# Patient Record
Sex: Female | Born: 1995 | Race: Black or African American | Hispanic: No | Marital: Single | Smoking: Never smoker
Health system: Southern US, Community
[De-identification: ages and names within clinical notes are randomized; demographics above are authoritative.]

## PROBLEM LIST (undated history)

## (undated) DIAGNOSIS — F4310 Post-traumatic stress disorder, unspecified: Secondary | ICD-10-CM

## (undated) DIAGNOSIS — G43909 Migraine, unspecified, not intractable, without status migrainosus: Secondary | ICD-10-CM

## (undated) DIAGNOSIS — I2699 Other pulmonary embolism without acute cor pulmonale: Secondary | ICD-10-CM

## (undated) DIAGNOSIS — I10 Essential (primary) hypertension: Secondary | ICD-10-CM

## (undated) HISTORY — PX: ORTHOPEDIC SURGERY: SHX850

---
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
## 2013-05-12 ENCOUNTER — Other Ambulatory Visit: Payer: Self-pay

## 2013-05-12 ENCOUNTER — Emergency Department (HOSPITAL_BASED_OUTPATIENT_CLINIC_OR_DEPARTMENT_OTHER): Payer: Medicaid Other

## 2013-05-12 ENCOUNTER — Emergency Department (HOSPITAL_BASED_OUTPATIENT_CLINIC_OR_DEPARTMENT_OTHER)
Admission: EM | Admit: 2013-05-12 | Discharge: 2013-05-12 | Disposition: A | Discharge status: Home / Self Care | Payer: Medicaid Other | Attending: Emergency Medicine | Admitting: Emergency Medicine

## 2013-05-12 ENCOUNTER — Encounter (HOSPITAL_BASED_OUTPATIENT_CLINIC_OR_DEPARTMENT_OTHER): Payer: Self-pay | Admitting: *Deleted

## 2013-05-12 DIAGNOSIS — R0602 Shortness of breath: Secondary | ICD-10-CM | POA: Insufficient documentation

## 2013-05-12 DIAGNOSIS — R6883 Chills (without fever): Secondary | ICD-10-CM | POA: Insufficient documentation

## 2013-05-12 DIAGNOSIS — Z8679 Personal history of other diseases of the circulatory system: Secondary | ICD-10-CM | POA: Insufficient documentation

## 2013-05-12 DIAGNOSIS — R0600 Dyspnea, unspecified: Secondary | ICD-10-CM

## 2013-05-12 DIAGNOSIS — Z79899 Other long term (current) drug therapy: Secondary | ICD-10-CM | POA: Insufficient documentation

## 2013-05-12 DIAGNOSIS — M79609 Pain in unspecified limb: Secondary | ICD-10-CM | POA: Insufficient documentation

## 2013-05-12 DIAGNOSIS — G43909 Migraine, unspecified, not intractable, without status migrainosus: Secondary | ICD-10-CM | POA: Insufficient documentation

## 2013-05-12 DIAGNOSIS — Z3202 Encounter for pregnancy test, result negative: Secondary | ICD-10-CM | POA: Insufficient documentation

## 2013-05-12 DIAGNOSIS — R609 Edema, unspecified: Secondary | ICD-10-CM | POA: Insufficient documentation

## 2013-05-12 DIAGNOSIS — R079 Chest pain, unspecified: Secondary | ICD-10-CM | POA: Insufficient documentation

## 2013-05-12 DIAGNOSIS — I10 Essential (primary) hypertension: Secondary | ICD-10-CM | POA: Insufficient documentation

## 2013-05-12 HISTORY — DX: Other pulmonary embolism without acute cor pulmonale: I26.99

## 2013-05-12 LAB — CBC WITH DIFFERENTIAL/PLATELET
Eosinophils Absolute: 0.3 10*3/uL (ref 0.0–0.7)
HCT: 41.6 % (ref 36.0–46.0)
Hemoglobin: 14.3 g/dL (ref 12.0–15.0)
Lymphs Abs: 3 10*3/uL (ref 0.7–4.0)
MCH: 29.9 pg (ref 26.0–34.0)
Monocytes Absolute: 0.7 10*3/uL (ref 0.1–1.0)
Monocytes Relative: 9 % (ref 3–12)
Neutro Abs: 3.6 10*3/uL (ref 1.7–7.7)
Neutrophils Relative %: 48 % (ref 43–77)
RBC: 4.79 MIL/uL (ref 3.87–5.11)

## 2013-05-12 LAB — COMPREHENSIVE METABOLIC PANEL
Alkaline Phosphatase: 98 U/L (ref 39–117)
BUN: 14 mg/dL (ref 6–23)
Chloride: 105 mEq/L (ref 96–112)
Creatinine, Ser: 0.9 mg/dL (ref 0.50–1.10)
GFR calc Af Amer: >90 mL/min (ref >90)
Glucose, Bld: 82 mg/dL (ref 70–99)
Potassium: 3.8 mEq/L (ref 3.5–5.1)
Total Bilirubin: 0.5 mg/dL (ref 0.3–1.2)

## 2013-05-12 LAB — URINALYSIS, ROUTINE W REFLEX MICROSCOPIC
Bilirubin Urine: NEGATIVE
Ketones, ur: NEGATIVE mg/dL
Nitrite: NEGATIVE
Protein, ur: NEGATIVE mg/dL
Urobilinogen, UA: 1 mg/dL (ref 0.0–1.0)

## 2013-05-12 LAB — URINE MICROSCOPIC-ADD ON

## 2013-05-12 LAB — D-DIMER, QUANTITATIVE: D-Dimer, Quant: 0.27 ug/mL-FEU (ref 0.00–0.48)

## 2013-05-12 MED ORDER — NAPROXEN 500 MG PO TABS: 500.0000 mg | ORAL_TABLET | Freq: Two times a day (BID) | ORAL | Status: DC

## 2013-05-12 MED ORDER — PREDNISONE 50 MG PO TABS: 50.0000 mg | ORAL_TABLET | Freq: Every day | ORAL | Status: DC

## 2013-05-12 NOTE — ED Provider Notes (Signed)
History    This chart was scribed for Dione Booze, MD by Donne Anon, ED Scribe. This patient was seen in room MH12/MH12 and the patient's care was started at 1459.   CSN: [id number]  Arrival date & time 05/12/13  1427   First MD Initiated Contact with Patient 05/12/13 1459      Chief Complaint  Patient presents with  . Shortness of Breath  . Chest Pain     The history is provided by the patient and a parent. No language interpreter was used.   HPI Comments:  Kristie Baker is a 17 y.o. female who presents to the Emergency Department complaining of 1 week of gradual onset, gradually worsening SOB. She states the SOB was constant all week and then worsened today, and resulted in [facility] referring her to the ED. She denies any modifying factors. She states she is experiencing leg pain (left worse than right) and CP, which is worsened by deep breaths. She reports associated leg swelling, hand swelling, finger swelling and chills. She has tried laying down and ibuprofen with little relief. She denies fever, nausea, vomiting, abdominal pain, back pain, rash, or any other pain. She denies recent long travel or recent surgery. She reports she was normally in good health, but states she has had some health issues since her "accident." She states she does not currently smoking or drinking.      Past Medical History  Diagnosis Date  . Hypertension   . Migraines   . Hypertension 2012    Irregularities in BP; no longer on medication.  . PE (pulmonary embolism)     Past Surgical History  Procedure Laterality Date  . Orthopedic surgery      Family History  Problem Relation Age of Onset  . Asthma Brother   . Cancer Maternal Aunt   . Diabetes Maternal Grandmother     History  Substance Use Topics  . Smoking status: Never Smoker   . Smokeless tobacco: Not on file  . Alcohol Use: No    OB History   Grav Para Term Preterm Abortions TAB SAB Ect Mult Living                   Review of Systems  Constitutional: Positive for chills.  Respiratory: Positive for shortness of breath.   Cardiovascular: Positive for chest pain and leg swelling.  Musculoskeletal: Positive for joint swelling.  All other systems reviewed and are negative.    Allergies  Amitriptyline and Cymbalta  Home Medications   Current Outpatient Rx  Name  Route  Sig  Dispense  Refill  . medroxyPROGESTERone (DEPO-PROVERA) 150 MG/ML injection   Intramuscular   Inject 150 mg into the muscle every 3 (three) months.         . promethazine (PHENERGAN) 25 MG tablet   Oral   Take 25 mg by mouth every 6 (six) hours as needed for nausea.         Marland Kitchen topiramate (TOPAMAX) 15 MG capsule   Oral   Take 15 mg by mouth 2 (two) times daily.         . cetirizine (ZYRTEC) 10 MG tablet   Oral   Take 1 tablet (10 mg total) by mouth daily.   30 tablet   3   . cholecalciferol (VITAMIN D) 1000 UNITS tablet   Oral   Take 1,000 Units by mouth daily.          . Multiple Vitamin (MULITIVITAMIN WITH MINERALS)  TABS   Oral   Take 1 tablet by mouth daily.           . Riboflavin (VITAMIN B-2) 50 MG TABS   Oral   Take 1 tablet (50 mg total) by mouth daily.   30 tablet   0     Triage Vitals; BP 120/75  Pulse 70  Temp(Src) 98.8 F (37.1 C) (Oral)  Resp 16  Ht 5\' 5"  (1.651 m)  Wt 138 lb 8 oz (62.823 kg)  BMI 23.05 kg/m2  SpO2 100%  Physical Exam  Nursing note and vitals reviewed. Constitutional: She is oriented to person, place, and time. She appears well-developed and well-nourished. No distress.  HENT:  Head: Normocephalic and atraumatic.  Eyes: EOM are normal.  Neck: Neck supple. No tracheal deviation present.  Cardiovascular: Normal rate, regular rhythm and normal heart sounds.   Pulmonary/Chest: Effort normal and breath sounds normal. No respiratory distress. She has no wheezes. She has no rales.  Musculoskeletal: Normal range of motion.  1+ pitting edema of legs. Mild  swelling of the hands without pitting edema. Right calf circumference is 1 cm greater than left calf circumference. No cord palpable. Negative Homan's sign.  Neurological: She is alert and oriented to person, place, and time.  Skin: Skin is warm and dry.  Psychiatric: She has a normal mood and affect. Her behavior is normal.    ED Course  Procedures (including critical care time) DIAGNOSTIC STUDIES: Oxygen Saturation is 100% on RA, normal by my interpretation.    COORDINATION OF CARE: 3:08 PM Discussed treatment plan which includes labs, CXR, EKG, urinalysis and ultrasound with pt at bedside and pt agreed to plan.    Results for orders placed during the hospital encounter of 05/12/13  CBC WITH DIFFERENTIAL      Result Value Range   WBC 7.6  4.0 - 10.5 K/uL   RBC 4.79  3.87 - 5.11 MIL/uL   Hemoglobin 14.3  12.0 - 15.0 g/dL   HCT 16.1  09.6 - 04.5 %   MCV 86.8  78.0 - 100.0 fL   MCH 29.9  26.0 - 34.0 pg   MCHC 34.4  30.0 - 36.0 g/dL   RDW 40.9  81.1 - 91.4 %   Platelets 244  150 - 400 K/uL   Neutrophils Relative % 48  43 - 77 %   Neutro Abs 3.6  1.7 - 7.7 K/uL   Lymphocytes Relative 39  12 - 46 %   Lymphs Abs 3.0  0.7 - 4.0 K/uL   Monocytes Relative 9  3 - 12 %   Monocytes Absolute 0.7  0.1 - 1.0 K/uL   Eosinophils Relative 4  0 - 5 %   Eosinophils Absolute 0.3  0.0 - 0.7 K/uL   Basophils Relative 0  0 - 1 %   Basophils Absolute 0.0  0.0 - 0.1 K/uL  COMPREHENSIVE METABOLIC PANEL      Result Value Range   Sodium 140  135 - 145 mEq/L   Potassium 3.8  3.5 - 5.1 mEq/L   Chloride 105  96 - 112 mEq/L   CO2 26  19 - 32 mEq/L   Glucose, Bld 82  70 - 99 mg/dL   BUN 14  6 - 23 mg/dL   Creatinine, Ser 7.82  0.50 - 1.10 mg/dL   Calcium 9.5  8.4 - 95.6 mg/dL   Total Protein 7.2  6.0 - 8.3 g/dL   Albumin 4.0  3.5 - 5.2 g/dL  AST 24  0 - 37 U/L   ALT 16  0 - 35 U/L   Alkaline Phosphatase 98  39 - 117 U/L   Total Bilirubin 0.5  0.3 - 1.2 mg/dL   GFR calc non Af Amer >90  >90  mL/min   GFR calc Af Amer >90  >90 mL/min  D-DIMER, QUANTITATIVE      Result Value Range   D-Dimer, Quant 0.27  0.00 - 0.48 ug/mL-FEU  URINALYSIS, ROUTINE W REFLEX MICROSCOPIC      Result Value Range   Color, Urine YELLOW  YELLOW   APPearance CLEAR  CLEAR   Specific Gravity, Urine 1.022  1.005 - 1.030   pH 8.0  5.0 - 8.0   Glucose, UA NEGATIVE  NEGATIVE mg/dL   Hgb urine dipstick NEGATIVE  NEGATIVE   Bilirubin Urine NEGATIVE  NEGATIVE   Ketones, ur NEGATIVE  NEGATIVE mg/dL   Protein, ur NEGATIVE  NEGATIVE mg/dL   Urobilinogen, UA 1.0  0.0 - 1.0 mg/dL   Nitrite NEGATIVE  NEGATIVE   Leukocytes, UA SMALL (*) NEGATIVE  PREGNANCY, URINE      Result Value Range   Preg Test, Ur NEGATIVE  NEGATIVE  URINE MICROSCOPIC-ADD ON      Result Value Range   Squamous Epithelial / LPF RARE  RARE   WBC, UA 0-2  <3 WBC/hpf   RBC / HPF 0-2  <3 RBC/hpf   Bacteria, UA FEW (*) RARE   Urine-Other MUCOUS PRESENT     Dg Chest 2 View  05/12/2013   *RADIOLOGY REPORT*  Clinical Data: Shortness of breath and chest pain.  CHEST - 2 VIEW  Comparison: PA and lateral chest 06/13/2011.  Findings: Lungs are clear.  Heart size is normal.  No pneumothorax or pleural fluid.  Old healed right clavicle fracture noted.  IMPRESSION: No acute disease.   Original Report Authenticated By: Holley Dexter, M.D.   US Venous Img Lower Bilateral  05/12/2013   *RADIOLOGY REPORT*  Clinical Data: Shortness of breath, chest pain, and bilateral leg pain for several days.  BILATERAL LOWER EXTREMITY VENOUS DUPLEX ULTRASOUND  Technique:  Gray-scale sonography with graded compression, as well as color Doppler and duplex ultrasound, were performed to evaluate the deep venous system of both lower extremities from the level of the common femoral vein through the popliteal and proximal calf veins.  Spectral Doppler was utilized to evaluate flow at rest and with distal augmentation maneuvers.  Comparison:  None.  Findings:  Normal compressibility  of bilateral common femoral, superficial femoral, and popliteal veins is demonstrated, as well as the visualized proximal calf veins.  No filling defects to suggest DVT on grayscale or color Doppler imaging.  Doppler waveforms show normal direction of venous flow, normal respiratory phasicity and response to augmentation.  IMPRESSION: No evidence of deep vein thrombosis in either lower extremity.   Original Report Authenticated By: Davonna Belling, M.D.      ECG shows normal sinus rhythm with a rate of 63, no ectopy. Normal axis. Normal P wave. Normal QRS. Normal intervals. Normal ST and T waves. Impression: normal ECG. No prior ECG available for comparison.   1. Dyspnea   2. Chest pain   3. Peripheral edema       MDM  Leg pain, chest pain, and dyspnea of uncertain cause. Her swelling does seem more generalized than just leg edema so I feel that DVT and pulmonary embolism is less likely. Urinalysis will be obtained to look for evidence of  proteinuria for possible nephrotic syndrome. This could be an early manifestation of an autoimmune disease such as lupus. Venous Dopplers will be obtained to rule out DVT.  Workup is negative including negative venous Doppler, normal chest x-ray, no evidence of proteinuria, normal renal function. At this point, I feel the most likely cause would be an autoimmune disorder such as lupus. She'll be given a therapeutic trial of prednisone and started on naproxen and she is to followup with her PCP in 1 week off to evaluate response to treatment and consider further workup.  I personally performed the services described in this documentation, which was scribed in my presence. The recorded information has been reviewed and is accurate.         Dione Booze, MD 05/12/13 1622

## 2013-05-12 NOTE — ED Notes (Signed)
Patient transported to X-ray 

## 2013-05-12 NOTE — ED Notes (Signed)
Sent here from [facility] for ? PE c/o SOb and cp x 1 week  HX PE

## 2013-05-12 NOTE — ED Notes (Signed)
MD at bedside. 

## 2013-06-11 ENCOUNTER — Other Ambulatory Visit: Payer: Self-pay | Admitting: Family Medicine

## 2014-09-06 ENCOUNTER — Encounter (HOSPITAL_BASED_OUTPATIENT_CLINIC_OR_DEPARTMENT_OTHER): Payer: Self-pay | Admitting: Emergency Medicine

## 2014-09-06 ENCOUNTER — Emergency Department (HOSPITAL_BASED_OUTPATIENT_CLINIC_OR_DEPARTMENT_OTHER)
Admission: EM | Admit: 2014-09-06 | Discharge: 2014-09-06 | Disposition: A | Discharge status: Home / Self Care | Payer: Medicaid Other | Attending: Emergency Medicine | Admitting: Emergency Medicine

## 2014-09-06 DIAGNOSIS — G43909 Migraine, unspecified, not intractable, without status migrainosus: Secondary | ICD-10-CM | POA: Insufficient documentation

## 2014-09-06 DIAGNOSIS — I10 Essential (primary) hypertension: Secondary | ICD-10-CM | POA: Diagnosis not present

## 2014-09-06 NOTE — ED Notes (Signed)
Patient here with complaint of frontal migraine headache x 2 days with vomiting, history of same. Has taken ibuprofen for same with none today

## 2014-09-06 NOTE — ED Notes (Signed)
Explained to patient delay and they stated they would see their primary MD

## 2016-03-19 ENCOUNTER — Emergency Department (HOSPITAL_BASED_OUTPATIENT_CLINIC_OR_DEPARTMENT_OTHER): Payer: Medicare HMO

## 2016-03-19 ENCOUNTER — Emergency Department (HOSPITAL_BASED_OUTPATIENT_CLINIC_OR_DEPARTMENT_OTHER)
Admission: EM | Admit: 2016-03-19 | Discharge: 2016-03-19 | Disposition: A | Discharge status: Home / Self Care | Payer: Medicare HMO | Attending: Emergency Medicine | Admitting: Emergency Medicine

## 2016-03-19 ENCOUNTER — Encounter (HOSPITAL_BASED_OUTPATIENT_CLINIC_OR_DEPARTMENT_OTHER): Payer: Self-pay | Admitting: *Deleted

## 2016-03-19 DIAGNOSIS — N83291 Other ovarian cyst, right side: Secondary | ICD-10-CM | POA: Diagnosis not present

## 2016-03-19 DIAGNOSIS — Z791 Long term (current) use of non-steroidal anti-inflammatories (NSAID): Secondary | ICD-10-CM | POA: Diagnosis not present

## 2016-03-19 DIAGNOSIS — N750 Cyst of Bartholin's gland: Secondary | ICD-10-CM | POA: Insufficient documentation

## 2016-03-19 DIAGNOSIS — R109 Unspecified abdominal pain: Secondary | ICD-10-CM | POA: Diagnosis present

## 2016-03-19 DIAGNOSIS — I10 Essential (primary) hypertension: Secondary | ICD-10-CM | POA: Insufficient documentation

## 2016-03-19 DIAGNOSIS — Z79899 Other long term (current) drug therapy: Secondary | ICD-10-CM | POA: Diagnosis not present

## 2016-03-19 DIAGNOSIS — Z86711 Personal history of pulmonary embolism: Secondary | ICD-10-CM | POA: Diagnosis not present

## 2016-03-19 LAB — CBC WITH DIFFERENTIAL/PLATELET
Basophils Absolute: 0 10*3/uL (ref 0.0–0.1)
Basophils Relative: 0 %
Eosinophils Absolute: 0 10*3/uL (ref 0.0–0.7)
Eosinophils Relative: 0 %
HCT: 38.6 % (ref 36.0–46.0)
Hemoglobin: 12.9 g/dL (ref 12.0–15.0)
Lymphocytes Relative: 12 %
Lymphs Abs: 1.7 10*3/uL (ref 0.7–4.0)
MCH: 29.1 pg (ref 26.0–34.0)
MCHC: 33.4 g/dL (ref 30.0–36.0)
MCV: 87.1 fL (ref 78.0–100.0)
Monocytes Absolute: 1.4 10*3/uL — ABNORMAL HIGH (ref 0.1–1.0)
Monocytes Relative: 10 %
Neutro Abs: 11.1 10*3/uL — ABNORMAL HIGH (ref 1.7–7.7)
Neutrophils Relative %: 78 %
Platelets: 212 10*3/uL (ref 150–400)
RBC: 4.43 MIL/uL (ref 3.87–5.11)
RDW: 12.7 % (ref 11.5–15.5)
WBC: 14.3 10*3/uL — ABNORMAL HIGH (ref 4.0–10.5)

## 2016-03-19 LAB — COMPREHENSIVE METABOLIC PANEL
ALT: 13 U/L — ABNORMAL LOW (ref 14–54)
AST: 16 U/L (ref 15–41)
Albumin: 3.6 g/dL (ref 3.5–5.0)
Alkaline Phosphatase: 53 U/L (ref 38–126)
Anion gap: 5 (ref 5–15)
BUN: 5 mg/dL — ABNORMAL LOW (ref 6–20)
CO2: 26 mmol/L (ref 22–32)
Calcium: 8.5 mg/dL — ABNORMAL LOW (ref 8.9–10.3)
Chloride: 110 mmol/L (ref 101–111)
Creatinine, Ser: 0.9 mg/dL (ref 0.44–1.00)
GFR calc Af Amer: >60 mL/min (ref >60)
GFR calc non Af Amer: >60 mL/min (ref >60)
Glucose, Bld: 89 mg/dL (ref 65–99)
Potassium: 3.2 mmol/L — ABNORMAL LOW (ref 3.5–5.1)
Sodium: 141 mmol/L (ref 135–145)
Total Bilirubin: 0.8 mg/dL (ref 0.3–1.2)
Total Protein: 6.8 g/dL (ref 6.5–8.1)

## 2016-03-19 LAB — URINALYSIS, ROUTINE W REFLEX MICROSCOPIC
Bilirubin Urine: NEGATIVE
GLUCOSE, UA: NEGATIVE mg/dL
Ketones, ur: NEGATIVE mg/dL
Nitrite: NEGATIVE
PROTEIN: 30 mg/dL — AB
Specific Gravity, Urine: 1.003 — ABNORMAL LOW (ref 1.005–1.030)
pH: 6 (ref 5.0–8.0)

## 2016-03-19 LAB — URINE MICROSCOPIC-ADD ON

## 2016-03-19 LAB — WET PREP, GENITAL
Sperm: NONE SEEN
Trich, Wet Prep: NONE SEEN
Yeast Wet Prep HPF POC: NONE SEEN

## 2016-03-19 LAB — PREGNANCY, URINE: PREG TEST UR: NEGATIVE

## 2016-03-19 MED ORDER — PROMETHAZINE HCL 25 MG PO TABS: 25.0000 mg | ORAL_TABLET | Freq: Three times a day (TID) | ORAL | Status: DC | PRN

## 2016-03-19 MED ORDER — ONDANSETRON HCL 4 MG/2ML IJ SOLN
4.0000 mg | Freq: Once | INTRAMUSCULAR | Status: AC
  Administered 2016-03-19: 4 mg via INTRAVENOUS
  Filled 2016-03-19: qty 2

## 2016-03-19 MED ORDER — IOPAMIDOL (ISOVUE-300) INJECTION 61%
100.0000 mL | Freq: Once | INTRAVENOUS | Status: AC | PRN
  Administered 2016-03-19: 100 mL via INTRAVENOUS

## 2016-03-19 MED ORDER — HYDROCODONE-ACETAMINOPHEN 5-325 MG PO TABS: 1.0000 | ORAL_TABLET | Freq: Four times a day (QID) | ORAL | Status: DC | PRN

## 2016-03-19 MED ORDER — MORPHINE SULFATE (PF) 4 MG/ML IV SOLN
4.0000 mg | Freq: Once | INTRAVENOUS | Status: AC
  Administered 2016-03-19: 4 mg via INTRAVENOUS
  Filled 2016-03-19: qty 1

## 2016-03-19 MED ORDER — SODIUM CHLORIDE 0.9 % IV BOLUS (SEPSIS)
1000.0000 mL | Freq: Once | INTRAVENOUS | Status: AC
  Administered 2016-03-19: 1000 mL via INTRAVENOUS

## 2016-03-19 MED ORDER — IBUPROFEN 400 MG PO TABS
600.0000 mg | ORAL_TABLET | Freq: Once | ORAL | Status: AC
  Administered 2016-03-19: 600 mg via ORAL
  Filled 2016-03-19: qty 1

## 2016-03-19 MED ORDER — DOXYCYCLINE HYCLATE 100 MG PO CAPS: 100.0000 mg | ORAL_CAPSULE | Freq: Two times a day (BID) | ORAL | Status: DC

## 2016-03-19 NOTE — Discharge Instructions (Signed)
You have a complex cystic structure of the right ovary and her Bartholin's gland also has a cyst that is why use are having pain in the labial area.  You will need to follow-up with the GYN provided.  Use warm soaks and warm compresses on the labial region.  Return here for any worsening in your condition

## 2016-03-19 NOTE — ED Notes (Signed)
Patient c/o RLQ abd pain/cramping, nausea. SHe states that she went to the doctor on Thursday and was treated for a yeast infection.

## 2016-03-19 NOTE — ED Provider Notes (Signed)
CSN: [id number]     Arrival date & time 03/19/16  1012 History   First MD Initiated Contact with Patient 03/19/16 1019     Chief Complaint  Patient presents with  . Abdominal Pain     (Consider location/radiation/quality/duration/timing/severity/associated sxs/prior Treatment) HPI Patient presents to the emergency department with mid to right sided abdominal pain started this past Wednesday, and the patient states that she was seen by her primary doctor and treated for a yeast infection.  Patient states that her condition has not improved since that time.  Patient states nothing seems make the condition better.  Palpation makes the pain worse.  Patient denies taking any other medications other than the ones prescribed.  Patient is also complaining of vaginal painThe patient denies chest pain, shortness of breath, headache,blurred vision, neck pain, fever, cough, weakness, numbness, dizziness, anorexia, edema, vomiting, diarrhea, rash, back pain, dysuria, hematemesis, bloody stool, vaginal bleeding near syncope, or syncope. Past Medical History  Diagnosis Date  . Hypertension   . Migraines   . Hypertension 2012    Irregularities in BP; no longer on medication.  . PE (pulmonary embolism)    Past Surgical History  Procedure Laterality Date  . Orthopedic surgery     Family History  Problem Relation Age of Onset  . Asthma Brother   . Cancer Maternal Aunt   . Diabetes Maternal Grandmother    Social History  Substance Use Topics  . Smoking status: Never Smoker   . Smokeless tobacco: None  . Alcohol Use: No   OB History    No data available     Review of Systems All other systems negative except as documented in the HPI. All pertinent positives and negatives as reviewed in the HPI.   Allergies  Amitriptyline and Cymbalta  Home Medications   Prior to Admission medications   Medication Sig Start Date End Date Taking? Authorizing Provider  ALPRAZolam (XANAX PO) Take by mouth as  needed.   Yes Historical Provider, MD  PAROXETINE HCL PO Take by mouth daily.   Yes Historical Provider, MD  cetirizine (ZYRTEC) 10 MG tablet Take 1 tablet (10 mg total) by mouth daily. 07/20/12 07/20/13  Roderick PeeMolly Rusin, MD  cholecalciferol (VITAMIN D) 1000 UNITS tablet Take 1,000 Units by mouth daily.     Historical Provider, MD  Multiple Vitamin (MULITIVITAMIN WITH MINERALS) TABS Take 1 tablet by mouth daily.      Historical Provider, MD  naproxen (NAPROSYN) 500 MG tablet Take 1 tablet (500 mg total) by mouth 2 (two) times daily. 05/12/13   Dione Boozeavid Glick, MD  Riboflavin (VITAMIN B-2) 50 MG TABS Take 1 tablet (50 mg total) by mouth daily. 07/20/12   Roderick PeeMolly Rusin, MD   BP 118/74 mmHg  Pulse 68  Temp(Src) 100.6 F (38.1 C) (Rectal)  Resp 16  SpO2 100%  LMP 02/23/2016 Physical Exam  Constitutional: She is oriented to person, place, and time. She appears well-developed and well-nourished. No distress.  HENT:  Head: Normocephalic and atraumatic.  Mouth/Throat: Oropharynx is clear and moist.  Eyes: Pupils are equal, round, and reactive to light.  Neck: Normal range of motion. Neck supple.  Cardiovascular: Normal rate, regular rhythm and normal heart sounds.  Exam reveals no gallop and no friction rub.   No murmur heard. Pulmonary/Chest: Effort normal and breath sounds normal. No respiratory distress. She has no wheezes.  Abdominal: Soft. Bowel sounds are normal. She exhibits no distension. There is tenderness. There is no rebound and no guarding.  Genitourinary:    Vaginal discharge found.  Neurological: She is alert and oriented to person, place, and time. She exhibits normal muscle tone. Coordination normal.  Skin: Skin is warm and dry. No rash noted. No erythema.  Psychiatric: She has a normal mood and affect. Her behavior is normal.  Nursing note and vitals reviewed.   ED Course  Procedures (including critical care time) Labs Review Labs Reviewed  WET PREP, GENITAL - Abnormal; Notable for  the following:    Clue Cells Wet Prep HPF POC PRESENT (*)    WBC, Wet Prep HPF POC MANY (*)    All other components within normal limits  URINALYSIS, ROUTINE W REFLEX MICROSCOPIC (NOT AT [facility]) - Abnormal; Notable for the following:    Specific Gravity, Urine 1.003 (*)    Hgb urine dipstick SMALL (*)    Protein, ur 30 (*)    Leukocytes, UA SMALL (*)    All other components within normal limits  COMPREHENSIVE METABOLIC PANEL - Abnormal; Notable for the following:    Potassium 3.2 (*)    BUN 5 (*)    Calcium 8.5 (*)    ALT 13 (*)    All other components within normal limits  CBC WITH DIFFERENTIAL/PLATELET - Abnormal; Notable for the following:    WBC 14.3 (*)    Neutro Abs 11.1 (*)    Monocytes Absolute 1.4 (*)    All other components within normal limits  URINE MICROSCOPIC-ADD ON - Abnormal; Notable for the following:    Squamous Epithelial / LPF 0-5 (*)    Bacteria, UA FEW (*)    All other components within normal limits  PREGNANCY, URINE  RPR  GC/CHLAMYDIA PROBE AMP ([place]) NOT AT [facility]    Imaging Review US Transvaginal Non-ob  03/19/2016  CLINICAL DATA:  Differential consider EXAM: TRANSABDOMINAL AND TRANSVAGINAL ULTRASOUND OF PELVIS DOPPLER ULTRASOUND OF OVARIES TECHNIQUE: Both transabdominal and transvaginal ultrasound examinations of the pelvis were performed. Transabdominal technique was performed for global imaging of the pelvis including uterus, ovaries, adnexal regions, and pelvic cul-de-sac. It was necessary to proceed with endovaginal exam following the transabdominal exam to visualize the uterus, endometrium and ovaries. Color and duplex Doppler ultrasound was utilized to evaluate blood flow to the ovaries. COMPARISON:  None. FINDINGS: Uterus Measurements: 9.6 x 3.4 x 5.2 cm. No fibroids or other mass visualized. Endometrium Thickness: 6.3 mm.  No focal abnormality visualized. Right ovary Measurements: 7.9 x 5.8 x 7.2 cm. Cystic structure in the right ovary measures 7.4  x 5.0 x 6.0 cm. This contains numerous internal areas of septation some of which appear thickened. No internal blood flow identified within this structure. Left ovary Measurements: 3.2 x 2.5 x 2.1 cm. Normal appearance/no adnexal mass. Pulsed Doppler evaluation of both ovaries demonstrates normal low-resistance arterial and venous waveforms. Other findings No abnormal free fluid. IMPRESSION: 1. Complex cystic structure within the right ovary contains numerous internal areas of septation some of which appear irregular and thickened. 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 Gynecologic consultation is advised. 2. No evidence for ovarian torsion. Electronically Signed   By: Signa Kell M.D.   On: 03/19/2016 15:08   US Pelvis Complete  03/19/2016  CLINICAL DATA:  Differential consider EXAM: TRANSABDOMINAL AND TRANSVAGINAL ULTRASOUND OF PELVIS DOPPLER ULTRASOUND OF OVARIES TECHNIQUE: Both transabdominal and transvaginal ultrasound examinations of the pelvis were performed. Transabdominal technique was performed for global imaging of the pelvis including uterus, ovaries, adnexal regions, and pelvic cul-de-sac. It  was necessary to proceed with endovaginal exam following the transabdominal exam to visualize the uterus, endometrium and ovaries. Color and duplex Doppler ultrasound was utilized to evaluate blood flow to the ovaries. COMPARISON:  None. FINDINGS: Uterus Measurements: 9.6 x 3.4 x 5.2 cm. No fibroids or other mass visualized. Endometrium Thickness: 6.3 mm.  No focal abnormality visualized. Right ovary Measurements: 7.9 x 5.8 x 7.2 cm. Cystic structure in the right ovary measures 7.4 x 5.0 x 6.0 cm. This contains numerous internal areas of septation some of which appear thickened. No internal blood flow identified within this structure. Left ovary Measurements: 3.2 x 2.5 x 2.1 cm. Normal appearance/no adnexal mass. Pulsed Doppler evaluation of both ovaries demonstrates normal  low-resistance arterial and venous waveforms. Other findings No abnormal free fluid. IMPRESSION: 1. Complex cystic structure within the right ovary contains numerous internal areas of septation some of which appear irregular and thickened. 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 Gynecologic consultation is advised. 2. No evidence for ovarian torsion. Electronically Signed   By: Signa Kell M.D.   On: 03/19/2016 15:08   Ct Abdomen Pelvis W Contrast  03/19/2016  CLINICAL DATA:  Bilateral lower abdominal pain for several days. Recent vaginal yeast infection. EXAM: CT ABDOMEN AND PELVIS WITH CONTRAST TECHNIQUE: Multidetector CT imaging of the abdomen and pelvis was performed using the standard protocol following bolus administration of intravenous contrast. CONTRAST:  ISOVUE-300 IOPAMIDOL (ISOVUE-300) INJECTION 61% COMPARISON:  None. FINDINGS: Minimal ground-glass opacity in the right middle lobe on image 5 of series 4, likely focal atelectasis. No suspicious infiltrates are seen in the lungs. There is a tiny effusion on the right however. The lung bases are otherwise within normal limits. No free air. Trace free fluid in the pelvis. The liver and portal vein are normal. Layering stones or wall thickening in the posterior gallbladder on axial image 30 aa. Possible enhancement of gallbladder wall on image 33 of the axial images. Trace fluid adjacent to the gallbladder is not excluded on these images. The spleen, adrenal glands, pancreas, and kidneys are normal. The abdominal aorta is normal in caliber with no dissection. No adenopathy. The stomach is normal in appearance. The small bowel is normal as well. The colon and visualized appendix are normal. No evidence of appendicitis. No adenopathy. There is a large cystic mass in the pelvis, displacing the uterus to the left and anteriorly. The mass measures 7.4 x 6.5 cm on series 2, image 66. A separate right ovary is not seen. The  left ovary is probably just posterior to the uterus on image 69. The bladder is normal. No adenopathy. Trace free fluid in the pelvis. A complicated collection is seen in the right labia, most consistent with a complicated Bartholin cyst. A similar but smaller finding is seen on the left on axial image 87. The larger right collection measures up to 4.4 by 2.9 cm. Surgical hardware is seen in the pelvis. Healed rib fractures are seen on the left. No acute bony abnormalities identified. IMPRESSION: 1. There is a large cystic mass in the pelvis, likely arising from the right ovary, measuring up to 7.4 cm. A pelvic ultrasound could better evaluate. 2. Bartholin Cysts in the labia. The right Barhtolin Cyst is large, measuring 4.4 cm, and complicated. Recommend clinical correlation. 3. The gallbladder may contain stones versus is polyps or a thickened wall. There may be a small amount of pericholecystic fluid and a small amount of wall enhancement is not excluded.  A right upper quadrant ultrasound could better evaluate the gallbladder. Electronically Signed   By: Gerome Sam III M.D   On: 03/19/2016 13:45   [place] Art/ven Flow Abd Pelv Doppler  03/19/2016  CLINICAL DATA:  Differential consider EXAM: TRANSABDOMINAL AND TRANSVAGINAL ULTRASOUND OF PELVIS DOPPLER ULTRASOUND OF OVARIES TECHNIQUE: Both transabdominal and transvaginal ultrasound examinations of the pelvis were performed. Transabdominal technique was performed for global imaging of the pelvis including uterus, ovaries, adnexal regions, and pelvic cul-de-sac. It was necessary to proceed with endovaginal exam following the transabdominal exam to visualize the uterus, endometrium and ovaries. Color and duplex Doppler ultrasound was utilized to evaluate blood flow to the ovaries. COMPARISON:  None. FINDINGS: Uterus Measurements: 9.6 x 3.4 x 5.2 cm. No fibroids or other mass visualized. Endometrium Thickness: 6.3 mm.  No focal abnormality visualized. Right ovary  Measurements: 7.9 x 5.8 x 7.2 cm. Cystic structure in the right ovary measures 7.4 x 5.0 x 6.0 cm. This contains numerous internal areas of septation some of which appear thickened. No internal blood flow identified within this structure. Left ovary Measurements: 3.2 x 2.5 x 2.1 cm. Normal appearance/no adnexal mass. Pulsed Doppler evaluation of both ovaries demonstrates normal low-resistance arterial and venous waveforms. Other findings No abnormal free fluid. IMPRESSION: 1. Complex cystic structure within the right ovary contains numerous internal areas of septation some of which appear irregular and thickened. 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 Gynecologic consultation is advised. 2. No evidence for ovarian torsion. Electronically Signed   By: Signa Kell M.D.   On: 03/19/2016 15:08   I have personally reviewed and evaluated these images and lab results as part of my medical decision-making.    MDM   Final diagnoses:  None     Patient is advised she will need close follow-up with GYN.  I advised her that her Bartholin's glands bilaterally have cyst, but at this point.  There is no identifiable abscess noted on examination.  I did advise her that she will need follow-up for this as well along with the lower abdominal pain complex cyst that is concerning for possible neoplasm.  Patient agrees the plan and all questions were answered  Charlestine Night, PA-C 03/22/16 1610  Glynn Octave, MD 03/22/16 [phone]

## 2016-03-20 LAB — GC/CHLAMYDIA PROBE AMP (~~LOC~~) NOT AT ARMC
Chlamydia: NEGATIVE
Neisseria Gonorrhea: NEGATIVE

## 2016-03-20 LAB — RPR: RPR Ser Ql: NONREACTIVE

## 2016-05-28 ENCOUNTER — Encounter (HOSPITAL_BASED_OUTPATIENT_CLINIC_OR_DEPARTMENT_OTHER): Payer: Self-pay | Admitting: Emergency Medicine

## 2016-05-28 ENCOUNTER — Emergency Department (HOSPITAL_BASED_OUTPATIENT_CLINIC_OR_DEPARTMENT_OTHER): Payer: Medicare HMO

## 2016-05-28 ENCOUNTER — Emergency Department (HOSPITAL_BASED_OUTPATIENT_CLINIC_OR_DEPARTMENT_OTHER)
Admission: EM | Admit: 2016-05-28 | Discharge: 2016-05-28 | Disposition: A | Discharge status: Home / Self Care | Payer: Medicare HMO | Attending: Emergency Medicine | Admitting: Emergency Medicine

## 2016-05-28 DIAGNOSIS — Z79899 Other long term (current) drug therapy: Secondary | ICD-10-CM | POA: Diagnosis not present

## 2016-05-28 DIAGNOSIS — O208 Other hemorrhage in early pregnancy: Secondary | ICD-10-CM | POA: Diagnosis present

## 2016-05-28 DIAGNOSIS — Z3A09 9 weeks gestation of pregnancy: Secondary | ICD-10-CM | POA: Insufficient documentation

## 2016-05-28 DIAGNOSIS — O039 Complete or unspecified spontaneous abortion without complication: Secondary | ICD-10-CM | POA: Insufficient documentation

## 2016-05-28 DIAGNOSIS — I10 Essential (primary) hypertension: Secondary | ICD-10-CM | POA: Diagnosis not present

## 2016-05-28 LAB — URINE MICROSCOPIC-ADD ON

## 2016-05-28 LAB — URINALYSIS, ROUTINE W REFLEX MICROSCOPIC
BILIRUBIN URINE: NEGATIVE
Glucose, UA: NEGATIVE mg/dL
KETONES UR: NEGATIVE mg/dL
NITRITE: NEGATIVE
PH: 6.5 (ref 5.0–8.0)
Protein, ur: NEGATIVE mg/dL
SPECIFIC GRAVITY, URINE: 1.018 (ref 1.005–1.030)

## 2016-05-28 LAB — HCG, QUANTITATIVE, PREGNANCY: HCG, BETA CHAIN, QUANT, S: 9701 m[IU]/mL — AB (ref <5)

## 2016-05-28 LAB — PREGNANCY, URINE: Preg Test, Ur: POSITIVE — AB

## 2016-05-28 MED ORDER — METOCLOPRAMIDE HCL 10 MG PO TABS
10.0000 mg | ORAL_TABLET | Freq: Once | ORAL | Status: AC
  Administered 2016-05-28: 10 mg via ORAL
  Filled 2016-05-28: qty 1

## 2016-05-28 NOTE — Discharge Instructions (Signed)
Call your OBGYN first thing tomorrow to schedule a follow up appointment sometime this week. Return to ER for new or worsening symptoms, any additional concerns.

## 2016-05-28 NOTE — ED Notes (Signed)
Patient states that she urinated earlier and she had 2 clots come out. She reports that she is [redacted] weeks pregnant

## 2016-05-28 NOTE — ED Provider Notes (Signed)
CSN: [id number]     Arrival date & time 05/28/16  1604 History   First MD Initiated Contact with Patient 05/28/16 1704     Chief Complaint  Patient presents with  . Vaginal Bleeding    (Consider location/radiation/quality/duration/timing/severity/associated sxs/prior Treatment) Patient is a 20 y.o. female presenting with vaginal bleeding. The history is provided by the patient and medical records. No language interpreter was used.  Vaginal Bleeding Associated symptoms: abdominal pain and nausea   Associated symptoms: no back pain, no dysuria, no fever and no vaginal discharge      Jessiah Hornbaker is a 20 y.o. female  who is [redacted] weeks pregnant, presenting to the Emergency Department complaining of vaginal bleeding this morning. Patient states she noticed two pea-sized clots while she was urinating this morning. Patient states since that time, she has noticed skant bright red blood while wiping 2 or 3 times today. She admits to intermittent mild right lower quadrant pain today as well. Admits to nausea. Denies vomiting, back pain, vaginal discharge, dysuria. Followed by [facility]. Taking prenatals as directed. On zofran as needed for nausea, but states that she has not had very much morning sickness throughout pregnancy so far.  Past Medical History  Diagnosis Date  . Hypertension   . Migraines   . Hypertension 2012    Irregularities in BP; no longer on medication.  . PE (pulmonary embolism)    Past Surgical History  Procedure Laterality Date  . Orthopedic surgery     Family History  Problem Relation Age of Onset  . Asthma Brother   . Cancer Maternal Aunt   . Diabetes Maternal Grandmother    Social History  Substance Use Topics  . Smoking status: Never Smoker   . Smokeless tobacco: None  . Alcohol Use: No   OB History    Gravida Para Term Preterm AB TAB SAB Ectopic Multiple Living   1              Review of Systems  Constitutional: Negative for fever.  HENT: Negative  for congestion.   Eyes: Negative for visual disturbance.  Respiratory: Negative for cough and shortness of breath.   Cardiovascular: Negative.   Gastrointestinal: Positive for nausea and abdominal pain. Negative for vomiting, diarrhea and constipation.  Genitourinary: Positive for vaginal bleeding. Negative for dysuria, vaginal discharge and vaginal pain.  Musculoskeletal: Negative for back pain and neck pain.  Skin: Negative for rash.  Neurological: Negative for headaches.      Allergies  Amitriptyline and Cymbalta  Home Medications   Prior to Admission medications   Medication Sig Start Date End Date Taking? Authorizing Provider  ondansetron (ZOFRAN-ODT) 4 MG disintegrating tablet Take 4 mg by mouth every 8 (eight) hours as needed for nausea or vomiting.   Yes Historical Provider, MD  ALPRAZolam (XANAX PO) Take by mouth as needed.    Historical Provider, MD  cetirizine (ZYRTEC) 10 MG tablet Take 1 tablet (10 mg total) by mouth daily. 07/20/12 07/20/13  Roderick Pee, MD  cholecalciferol (VITAMIN D) 1000 UNITS tablet Take 1,000 Units by mouth daily.     Historical Provider, MD  doxycycline (VIBRAMYCIN) 100 MG capsule Take 1 capsule (100 mg total) by mouth 2 (two) times daily. 03/19/16   Charlestine Night, PA-C  HYDROcodone-acetaminophen (NORCO/VICODIN) 5-325 MG tablet Take 1 tablet by mouth every 6 (six) hours as needed for moderate pain. 03/19/16   Charlestine Night, PA-C  Multiple Vitamin (MULITIVITAMIN WITH MINERALS) TABS Take 1 tablet by mouth  daily.      Historical Provider, MD  naproxen (NAPROSYN) 500 MG tablet Take 1 tablet (500 mg total) by mouth 2 (two) times daily. 05/12/13   Dione Booze, MD  PAROXETINE HCL PO Take by mouth daily.    Historical Provider, MD  promethazine (PHENERGAN) 25 MG tablet Take 1 tablet (25 mg total) by mouth every 8 (eight) hours as needed for nausea or vomiting. 03/19/16   Charlestine Night, PA-C  Riboflavin (VITAMIN B-2) 50 MG TABS Take 1 tablet (50 mg total)  by mouth daily. 07/20/12   Molly Rusin, MD   BP 102/66 mmHg  Pulse 72  Temp(Src) 98.6 F (37 C) (Oral)  Resp 18  Ht 5\' 11"  (1.803 m)  Wt 69.854 kg  BMI 21.49 kg/m2  SpO2 100%  LMP 02/23/2016 Physical Exam  Constitutional: She is oriented to person, place, and time. She appears well-developed and well-nourished.  Alert and in no acute distress  HENT:  Head: Normocephalic and atraumatic.  Cardiovascular: Normal rate, regular rhythm and normal heart sounds.  Exam reveals no gallop and no friction rub.   No murmur heard. Pulmonary/Chest: Effort normal and breath sounds normal. No respiratory distress.  Abdominal: Soft. Bowel sounds are normal. She exhibits no distension and no mass. There is tenderness (Mild RLQ tenderness). There is no rebound and no guarding.  Genitourinary:  Chaperone present for exam. Cervical os open with active bleeding, no clots or particles appreciated.   Neurological: She is alert and oriented to person, place, and time.  Skin: Skin is warm and dry.  Nursing note and vitals reviewed.   ED Course  Procedures (including critical care time) Labs Review Labs Reviewed  PREGNANCY, URINE - Abnormal; Notable for the following:    Preg Test, Ur POSITIVE (*)    All other components within normal limits  URINALYSIS, ROUTINE W REFLEX MICROSCOPIC (NOT AT [facility]) - Abnormal; Notable for the following:    APPearance CLOUDY (*)    Hgb urine dipstick LARGE (*)    Leukocytes, UA MODERATE (*)    All other components within normal limits  HCG, QUANTITATIVE, PREGNANCY - Abnormal; Notable for the following:    hCG, Beta Chain, Quant, S 9701 (*)    All other components within normal limits  URINE MICROSCOPIC-ADD ON - Abnormal; Notable for the following:    Squamous Epithelial / LPF 0-5 (*)    Bacteria, UA FEW (*)    All other components within normal limits    Imaging Review US Ob Comp Less 14 Wks  05/28/2016  CLINICAL DATA:  Vaginal bleeding. Gestational age by last  menstrual period 9 weeks and 0 days. History of pulmonary embolism and RIGHT adnexal cystic mass. EXAM: OBSTETRIC <14 WK ULTRASOUND TECHNIQUE: Transabdominal ultrasound was performed for evaluation of the gestation as well as the maternal uterus and adnexal regions. COMPARISON:  Pelvic ultrasound March 19, 2016. FINDINGS: Intrauterine gestational sac: Present Yolk sac:  Not present Embryo:  Present Cardiac Activity: Not present CRL:   17  mm   8 w 1 d                  [place] EDC: January 06, 2017 Subchorionic hemorrhage:  None visualized. Maternal uterus/adnexae: 13 mm RIGHT adnexal corpus luteal cyst, interval resolution of complex cystic RIGHT ovarian structure on prior pelvic sonogram. IMPRESSION: Single 17 mm intrauterine pregnancy without cardiac activity. Findings meet definitive criteria for failed pregnancy. This follows SRU consensus guidelines: Diagnostic Criteria for Nonviable Pregnancy Early in the  First Trimester. Macy Mis Engl J Med [id number];369:1443-51. Interval resolution of RIGHT ovarian mass seen on prior pelvic ultrasound. Acute findings discussed with and reconfirmed by Elizabeth SauerJAIME WARD, PA on 05/28/2016 at 7:44 pm. Electronically Signed   By: Awilda Metroourtnay  Bloomer M.D.   On: 05/28/2016 19:44   [place] Ob Transvaginal  05/28/2016  CLINICAL DATA:  Vaginal bleeding. Gestational age by last menstrual period 9 weeks and 0 days. History of pulmonary embolism and RIGHT adnexal cystic mass. EXAM: OBSTETRIC <14 WK ULTRASOUND TECHNIQUE: Transabdominal ultrasound was performed for evaluation of the gestation as well as the maternal uterus and adnexal regions. COMPARISON:  Pelvic ultrasound March 19, 2016. FINDINGS: Intrauterine gestational sac: Present Yolk sac:  Not present Embryo:  Present Cardiac Activity: Not present CRL:   17  mm   8 w 1 d                  [place] EDC: January 06, 2017 Subchorionic hemorrhage:  None visualized. Maternal uterus/adnexae: 13 mm RIGHT adnexal corpus luteal cyst, interval resolution of complex cystic RIGHT  ovarian structure on prior pelvic sonogram. IMPRESSION: Single 17 mm intrauterine pregnancy without cardiac activity. Findings meet definitive criteria for failed pregnancy. This follows SRU consensus guidelines: Diagnostic Criteria for Nonviable Pregnancy Early in the First Trimester. Macy Mis Engl J Med [id number];369:1443-51. Interval resolution of RIGHT ovarian mass seen on prior pelvic ultrasound. Acute findings discussed with and reconfirmed by Elizabeth SauerJAIME WARD, PA on 05/28/2016 at 7:44 pm. Electronically Signed   By: Awilda Metroourtnay  Bloomer M.D.   On: 05/28/2016 19:44   I have personally reviewed and evaluated these images and lab results as part of my medical decision-making.   EKG Interpretation None      MDM   Final diagnoses:  Miscarriage   Trudee Mayford KnifeWilliams is a 769 week pregnant female who presents to ED for vaginal bleeding that began this morning associated with lower right abdominal cramping. hcg obtained. Pelvic exam performed showing small amount of active bleeding from cervical os, concern for miscarriage. U/S obtained which showed 17mm IUP with no fetal heart tones. Patient informed of results showing nonviable pregnancy.  Per chart review under care everywhere, patient is B+. Call OB/GYN tomorrow to schedule a follow-up appointment in regards to today's visit. Patient informed of what to expect over the next few days. Mother and significant other at bedside. All questions were answered.  Patient discussed with Dr. Lynelle DoctorKnapp who agrees with treatment plan.   [facility] Endoscopy LLCJaime Pilcher Ward, PA-C 05/28/16 2325  Linwood DibblesJon Knapp, MD 05/30/16 1321

## 2016-08-07 ENCOUNTER — Encounter (HOSPITAL_BASED_OUTPATIENT_CLINIC_OR_DEPARTMENT_OTHER): Payer: Self-pay | Admitting: *Deleted

## 2016-08-07 ENCOUNTER — Emergency Department (HOSPITAL_BASED_OUTPATIENT_CLINIC_OR_DEPARTMENT_OTHER)
Admission: EM | Admit: 2016-08-07 | Discharge: 2016-08-07 | Disposition: A | Discharge status: Home / Self Care | Payer: Medicare HMO | Attending: Emergency Medicine | Admitting: Emergency Medicine

## 2016-08-07 DIAGNOSIS — B9689 Other specified bacterial agents as the cause of diseases classified elsewhere: Secondary | ICD-10-CM

## 2016-08-07 DIAGNOSIS — G43109 Migraine with aura, not intractable, without status migrainosus: Secondary | ICD-10-CM | POA: Diagnosis not present

## 2016-08-07 DIAGNOSIS — R112 Nausea with vomiting, unspecified: Secondary | ICD-10-CM

## 2016-08-07 DIAGNOSIS — R42 Dizziness and giddiness: Secondary | ICD-10-CM | POA: Diagnosis present

## 2016-08-07 DIAGNOSIS — N760 Acute vaginitis: Secondary | ICD-10-CM | POA: Insufficient documentation

## 2016-08-07 DIAGNOSIS — R55 Syncope and collapse: Secondary | ICD-10-CM | POA: Diagnosis not present

## 2016-08-07 DIAGNOSIS — R102 Pelvic and perineal pain: Secondary | ICD-10-CM

## 2016-08-07 DIAGNOSIS — I10 Essential (primary) hypertension: Secondary | ICD-10-CM | POA: Diagnosis not present

## 2016-08-07 DIAGNOSIS — N898 Other specified noninflammatory disorders of vagina: Secondary | ICD-10-CM

## 2016-08-07 LAB — URINE MICROSCOPIC-ADD ON

## 2016-08-07 LAB — WET PREP, GENITAL
Sperm: NONE SEEN
Trich, Wet Prep: NONE SEEN
YEAST WET PREP: NONE SEEN

## 2016-08-07 LAB — PREGNANCY, URINE: Preg Test, Ur: NEGATIVE

## 2016-08-07 LAB — URINALYSIS, ROUTINE W REFLEX MICROSCOPIC
BILIRUBIN URINE: NEGATIVE
GLUCOSE, UA: NEGATIVE mg/dL
Hgb urine dipstick: NEGATIVE
Ketones, ur: NEGATIVE mg/dL
NITRITE: NEGATIVE
PH: 6.5 (ref 5.0–8.0)
Protein, ur: NEGATIVE mg/dL
SPECIFIC GRAVITY, URINE: 1.031 — AB (ref 1.005–1.030)

## 2016-08-07 LAB — COMPREHENSIVE METABOLIC PANEL
ALT: 16 U/L (ref 14–54)
ANION GAP: 6 (ref 5–15)
AST: 17 U/L (ref 15–41)
Albumin: 4.6 g/dL (ref 3.5–5.0)
Alkaline Phosphatase: 59 U/L (ref 38–126)
BILIRUBIN TOTAL: 0.7 mg/dL (ref 0.3–1.2)
BUN: 14 mg/dL (ref 6–20)
CALCIUM: 9.2 mg/dL (ref 8.9–10.3)
CO2: 28 mmol/L (ref 22–32)
Chloride: 103 mmol/L (ref 101–111)
Creatinine, Ser: 0.71 mg/dL (ref 0.44–1.00)
GFR calc Af Amer: >60 mL/min (ref >60)
Glucose, Bld: 104 mg/dL — ABNORMAL HIGH (ref 65–99)
POTASSIUM: 3.8 mmol/L (ref 3.5–5.1)
Sodium: 137 mmol/L (ref 135–145)
TOTAL PROTEIN: 7.9 g/dL (ref 6.5–8.1)

## 2016-08-07 LAB — CBC WITH DIFFERENTIAL/PLATELET
BASOS PCT: 0 %
Basophils Absolute: 0 10*3/uL (ref 0.0–0.1)
EOS PCT: 0 %
Eosinophils Absolute: 0 10*3/uL (ref 0.0–0.7)
HEMATOCRIT: 46.1 % — AB (ref 36.0–46.0)
Hemoglobin: 15.6 g/dL — ABNORMAL HIGH (ref 12.0–15.0)
Lymphocytes Relative: 11 %
Lymphs Abs: 1.3 10*3/uL (ref 0.7–4.0)
MCH: 29.1 pg (ref 26.0–34.0)
MCHC: 33.8 g/dL (ref 30.0–36.0)
MCV: 86 fL (ref 78.0–100.0)
MONO ABS: 0.5 10*3/uL (ref 0.1–1.0)
Monocytes Relative: 4 %
Neutro Abs: 10.7 10*3/uL — ABNORMAL HIGH (ref 1.7–7.7)
Neutrophils Relative %: 85 %
Platelets: 237 10*3/uL (ref 150–400)
RBC: 5.36 MIL/uL — ABNORMAL HIGH (ref 3.87–5.11)
RDW: 12.5 % (ref 11.5–15.5)
WBC: 12.6 10*3/uL — AB (ref 4.0–10.5)

## 2016-08-07 MED ORDER — PROCHLORPERAZINE EDISYLATE 5 MG/ML IJ SOLN
10.0000 mg | Freq: Once | INTRAMUSCULAR | Status: AC
  Administered 2016-08-07: 10 mg via INTRAVENOUS
  Filled 2016-08-07: qty 2

## 2016-08-07 MED ORDER — METRONIDAZOLE 500 MG PO TABS: 500.0000 mg | ORAL_TABLET | Freq: Two times a day (BID) | ORAL | 0 refills | Status: AC

## 2016-08-07 MED ORDER — METRONIDAZOLE 500 MG PO TABS
500.0000 mg | ORAL_TABLET | Freq: Once | ORAL | Status: AC
  Administered 2016-08-07: 500 mg via ORAL
  Filled 2016-08-07: qty 1

## 2016-08-07 MED ORDER — DEXAMETHASONE SODIUM PHOSPHATE 10 MG/ML IJ SOLN
10.0000 mg | Freq: Once | INTRAMUSCULAR | Status: AC
Start: 2016-08-07 — End: 2016-08-07
  Administered 2016-08-07: 10 mg via INTRAVENOUS
  Filled 2016-08-07: qty 1

## 2016-08-07 MED ORDER — DIPHENHYDRAMINE HCL 50 MG/ML IJ SOLN
25.0000 mg | Freq: Once | INTRAMUSCULAR | Status: AC
  Administered 2016-08-07: 25 mg via INTRAVENOUS
  Filled 2016-08-07: qty 1

## 2016-08-07 MED ORDER — SODIUM CHLORIDE 0.9 % IV BOLUS (SEPSIS)
500.0000 mL | Freq: Once | INTRAVENOUS | Status: AC
  Administered 2016-08-07: 500 mL via INTRAVENOUS

## 2016-08-07 NOTE — ED Provider Notes (Addendum)
[facility] Provider Note   CSN: [id number] Arrival date & time: 08/07/16  1108     History   Chief Complaint Chief Complaint  Patient presents with  . Dizziness  . Abdominal Pain  . Loss of Consciousness    HPI Kristie Baker is a 20 y.o. female.  The history is provided by the patient.  Dizziness  Quality:  Lightheadedness Severity:  Moderate Onset quality:  Gradual Duration:  3 hours Timing:  Intermittent Progression:  Waxing and waning Chronicity:  New Relieved by:  Nothing Worsened by:  Nothing Associated symptoms: headaches, nausea, syncope and vomiting   Associated symptoms: no blood in stool, no chest pain, no diarrhea and no shortness of breath   Abdominal Pain   This is a new problem. Episode onset: 3 hours. Episode frequency: intermittent. The problem has not changed since onset.The pain is located in the suprapubic region. Associated symptoms include nausea, vomiting and headaches. Pertinent negatives include fever, diarrhea and dysuria. Nothing aggravates the symptoms. Nothing relieves the symptoms.  Loss of Consciousness   This is a new problem. The problem occurs rarely. The problem has been resolved. Associated with: post emesis. Associated symptoms include abdominal pain, dizziness, headaches, nausea and vomiting. Pertinent negatives include back pain, chest pain, congestion and fever. She has tried nothing for the symptoms.  Migraine  This is a recurrent problem. Episode onset: 3 hours. The problem occurs constantly. The problem has not changed since onset.Associated symptoms include abdominal pain and headaches. Pertinent negatives include no chest pain and no shortness of breath. Exacerbated by: light. Nothing relieves the symptoms. She has tried nothing for the symptoms.    Past Medical History:  Diagnosis Date  . Hypertension   . Hypertension 2012   Irregularities in BP; no longer on medication.  . Migraines   . PE (pulmonary  embolism)     Patient Active Problem List   Diagnosis Date Noted  . Acute sinus infection 07/20/2012  . 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 Blurred vision, bilateral 07/19/2012  . Status migrainosus 07/19/2012    Past Surgical History:  Procedure Laterality Date  . ORTHOPEDIC SURGERY      OB History    Gravida Para Term Preterm AB Living   1             SAB TAB Ectopic Multiple Live Births                   Home Medications    Prior to Admission medications   Medication Sig Start Date End Date Taking? Authorizing Provider  ALPRAZolam (XANAX PO) Take by mouth as needed.    Historical Provider, MD  cetirizine (ZYRTEC) 10 MG tablet Take 1 tablet (10 mg total) by mouth daily. 07/20/12 07/20/13  Roderick Pee, MD  cholecalciferol (VITAMIN D) 1000 UNITS tablet Take 1,000 Units by mouth daily.     Historical Provider, MD  doxycycline (VIBRAMYCIN) 100 MG capsule Take 1 capsule (100 mg total) by mouth 2 (two) times daily. 03/19/16   Charlestine Night, PA-C  HYDROcodone-acetaminophen (NORCO/VICODIN) 5-325 MG tablet Take 1 tablet by mouth every 6 (six) hours as needed for moderate pain. 03/19/16   Charlestine Night, PA-C  metroNIDAZOLE (FLAGYL) 500 MG tablet Take 1 tablet (500 mg total) by mouth 2 (two) times daily. 08/07/16 08/14/16  Nira Conn, MD  Multiple Vitamin (MULITIVITAMIN WITH MINERALS) TABS Take 1 tablet by mouth daily.      Historical Provider, MD  naproxen (NAPROSYN) 500 MG tablet  Take 1 tablet (500 mg total) by mouth 2 (two) times daily. 05/12/13   Dione Boozeavid Glick, MD  ondansetron (ZOFRAN-ODT) 4 MG disintegrating tablet Take 4 mg by mouth every 8 (eight) hours as needed for nausea or vomiting.    Historical Provider, MD  PAROXETINE HCL PO Take by mouth daily.    Historical Provider, MD  promethazine (PHENERGAN) 25 MG tablet Take 1 tablet (25 mg total) by mouth every 8 (eight) hours as needed for nausea or vomiting. 03/19/16   Charlestine Nighthristopher Lawyer, PA-C  Riboflavin (VITAMIN B-2)  50 MG TABS Take 1 tablet (50 mg total) by mouth daily. 07/20/12   Roderick PeeMolly Rusin, MD    Family History Family History  Problem Relation Age of Onset  . Asthma Brother   . Cancer Maternal Aunt   . Diabetes Maternal Grandmother     Social History Social History  Substance Use Topics  . Smoking status: Never Smoker  . Smokeless tobacco: Never Used  . Alcohol use No     Allergies   Amitriptyline and Cymbalta [duloxetine hcl]   Review of Systems Review of Systems  Constitutional: Negative for chills and fever.  HENT: Negative for congestion and rhinorrhea.   Eyes: Negative for visual disturbance.  Respiratory: Negative for shortness of breath.   Cardiovascular: Positive for syncope. Negative for chest pain.  Gastrointestinal: Positive for abdominal pain, nausea and vomiting. Negative for blood in stool and diarrhea.  Genitourinary: Positive for vaginal discharge. Negative for dysuria, flank pain and vaginal bleeding.  Musculoskeletal: Negative for back pain.  Neurological: Positive for dizziness and headaches.  All other systems reviewed and are negative.    Physical Exam Updated Vital Signs BP 105/84   Pulse (!) 53   Temp 98 F (36.7 C) (Oral)   Resp 18   Ht 5\' 5"  (1.651 m)   Wt 157 lb 8 oz (71.4 kg)   LMP 07/11/2016 Comment: recent miscarriage  SpO2 100%   Breastfeeding? No   BMI 26.21 kg/m   Physical Exam  Constitutional: She is oriented to person, place, and time. She appears well-developed and well-nourished. No distress.  HENT:  Head: Normocephalic and atraumatic.  Right Ear: External ear normal.  Left Ear: External ear normal.  Nose: Nose normal.  Eyes: Conjunctivae and EOM are normal. Pupils are equal, round, and reactive to light. Right eye exhibits no discharge. Left eye exhibits no discharge. No scleral icterus.  Neck: Normal range of motion. Neck supple.  Cardiovascular: Normal rate, regular rhythm and normal heart sounds.  Exam reveals no gallop and  no friction rub.   No murmur heard. Pulmonary/Chest: Effort normal and breath sounds normal. No stridor. No respiratory distress. She has no wheezes.  Abdominal: Soft. She exhibits no distension. There is tenderness in the suprapubic area. There is no rigidity, no rebound, no guarding, no CVA tenderness, no tenderness at McBurney's point and negative Murphy's sign.  Genitourinary: Pelvic exam was performed with patient supine. There is no rash or tenderness on the right labia. There is no rash or tenderness on the left labia. Cervix exhibits no motion tenderness, no discharge and no friability. Right adnexum displays no mass and no tenderness. Left adnexum displays no mass and no tenderness. No erythema, tenderness or bleeding in the vagina. No foreign body in the vagina. No signs of injury around the vagina. Vaginal discharge found.  Genitourinary Comments: Chaperone present during pelvic exam.   Musculoskeletal: She exhibits no edema or tenderness.  Neurological: She is alert and oriented  to person, place, and time.  Skin: Skin is warm and dry. No rash noted. She is not diaphoretic. No erythema.  Psychiatric: She has a normal mood and affect.     ED Treatments / Results  Labs (all labs ordered are listed, but only abnormal results are displayed) Labs Reviewed  WET PREP, GENITAL - Abnormal; Notable for the following:       Result Value   Clue Cells Wet Prep HPF POC PRESENT (*)    WBC, Wet Prep HPF POC MANY (*)    All other components within normal limits  URINALYSIS, ROUTINE W REFLEX MICROSCOPIC (NOT AT [facility]) - Abnormal; Notable for the following:    APPearance CLOUDY (*)    Specific Gravity, Urine 1.031 (*)    Leukocytes, UA SMALL (*)    All other components within normal limits  URINE MICROSCOPIC-ADD ON - Abnormal; Notable for the following:    Squamous Epithelial / LPF 6-30 (*)    Bacteria, UA FEW (*)    All other components within normal limits  CBC WITH DIFFERENTIAL/PLATELET -  Abnormal; Notable for the following:    WBC 12.6 (*)    RBC 5.36 (*)    Hemoglobin 15.6 (*)    HCT 46.1 (*)    Neutro Abs 10.7 (*)    All other components within normal limits  COMPREHENSIVE METABOLIC PANEL - Abnormal; Notable for the following:    Glucose, Bld 104 (*)    All other components within normal limits  PREGNANCY, URINE  GC/CHLAMYDIA PROBE AMP ([place]) NOT AT [facility]    EKG  EKG Interpretation  Date/Time:  Monday August 07 2016 12:38:24 EDT Ventricular Rate:  65 PR Interval:    QRS Duration: 78 QT Interval:  399 QTC Calculation: 415 R Axis:   68 Text Interpretation:  Sinus rhythm Confirmed by [facility] MD, Emillia Weatherly ([id number]) on 08/07/2016 3:29:28 PM       Radiology No results found.  Procedures Procedures (including critical care time)  Medications Ordered in ED Medications  diphenhydrAMINE (BENADRYL) injection 25 mg (25 mg Intravenous Given 08/07/16 1253)  dexamethasone (DECADRON) injection 10 mg (10 mg Intravenous Given 08/07/16 1253)  prochlorperazine (COMPAZINE) injection 10 mg (10 mg Intravenous Given 08/07/16 1253)  sodium chloride 0.9 % bolus 500 mL (0 mLs Intravenous Stopped 08/07/16 1406)  metroNIDAZOLE (FLAGYL) tablet 500 mg (500 mg Oral Given 08/07/16 1545)     Initial Impression / Assessment and Plan / ED Course  I have reviewed the triage vital signs and the nursing notes.  Pertinent labs & imaging results that were available during my care of the patient were reviewed by me and considered in my medical decision making (see chart for details).  Clinical Course    1. Migraine Typical migraine headache for the pt. Non focal neuro exam. No recent head trauma. No fever. Doubt meningitis. Doubt intracranial bleed. Doubt IIH. No indication for imaging. Will treat with migraine cocktail and reevaluate.  Almost complete resolution of patient's migraine headache.  2. abd pain Mild suprapubic discomfort otherwise abdomen benign. GU exam with vaginal  discharge. Low suspicion for appendicitis, diverticulitis, colitis, small bowel obstruction. UPT negative. UA contaminated however without evidence of infection. Leukocytosis on CBC, which would be explained by the margination from emesis/syncope. CMP without evidence of electrolyte derangements or renal insufficiency. Wet prep positive for clue cells suggesting bacterial vaginosis. We will treat with metronidazole. No Trichomonas. No CMT or cervicitis to suggest GC chlamydia infection.  3.  Syncope Posttussive syncope.  EKG without evidence of arrhythmias, Brugada, ischemia, or blocks.   Patient is well-appearing and nontoxic. Feel that she is safe for discharge with PCP follow-up.  Final Clinical Impressions(s) / ED Diagnoses   Final diagnoses:  Migraine with aura and without status migrainosus, not intractable  Pelvic pain in female  Vaginal discharge  Syncope and collapse  Non-intractable vomiting with nausea, vomiting of unspecified type  Bacterial vaginosis   Disposition: Discharge  Condition: Good  I have discussed the results, Dx and Tx plan with the patient who expressed understanding and agree(s) with the plan. Discharge instructions discussed at great length. The patient was given strict return precautions who verbalized understanding of the instructions. No further questions at time of discharge.    Discharge Medication List as of 08/07/2016  3:22 PM    START taking these medications   Details  metroNIDAZOLE (FLAGYL) 500 MG tablet Take 1 tablet (500 mg total) by mouth 2 (two) times daily., Starting Mon 08/07/2016, Until Mon 08/14/2016, Print        Follow Up: Daylene Katayama, [place] [address] [place] [address] [phone]  Call  As needed      Nira Conn, MD 08/07/16 [id number]

## 2016-08-07 NOTE — ED Notes (Addendum)
While at work today, patient reports episode of abdominal pain, five minutes of nausea/vomiting, dizziness, and then "passed out" while her boyfriend assisted her to stand up.  Patient has hx of miscarriage in June.  Has irregular periods; LMP "a couple weeks" ago.  Also states this pain is similar to pain she had in April when she had a cyst on her ovary.  Now c/o headache, 10/10.  Has not vomited since.

## 2016-08-07 NOTE — ED Triage Notes (Signed)
States she was at work and got dizzy, abdominal and she passed out. Vomiting since.

## 2016-08-08 LAB — GC/CHLAMYDIA PROBE AMP (~~LOC~~) NOT AT ARMC
Chlamydia: NEGATIVE
Neisseria Gonorrhea: NEGATIVE

## 2016-11-29 ENCOUNTER — Encounter (HOSPITAL_BASED_OUTPATIENT_CLINIC_OR_DEPARTMENT_OTHER): Payer: Self-pay | Admitting: *Deleted

## 2016-11-29 ENCOUNTER — Emergency Department (HOSPITAL_BASED_OUTPATIENT_CLINIC_OR_DEPARTMENT_OTHER): Payer: Medicare HMO

## 2016-11-29 ENCOUNTER — Emergency Department (HOSPITAL_BASED_OUTPATIENT_CLINIC_OR_DEPARTMENT_OTHER)
Admission: EM | Admit: 2016-11-29 | Discharge: 2016-11-29 | Disposition: A | Discharge status: Home / Self Care | Payer: Medicare HMO | Attending: Physician Assistant | Admitting: Physician Assistant

## 2016-11-29 DIAGNOSIS — O209 Hemorrhage in early pregnancy, unspecified: Secondary | ICD-10-CM | POA: Diagnosis present

## 2016-11-29 DIAGNOSIS — O009 Unspecified ectopic pregnancy without intrauterine pregnancy: Secondary | ICD-10-CM | POA: Insufficient documentation

## 2016-11-29 DIAGNOSIS — R5383 Other fatigue: Secondary | ICD-10-CM | POA: Insufficient documentation

## 2016-11-29 DIAGNOSIS — Z3A01 Less than 8 weeks gestation of pregnancy: Secondary | ICD-10-CM | POA: Insufficient documentation

## 2016-11-29 DIAGNOSIS — O23591 Infection of other part of genital tract in pregnancy, first trimester: Secondary | ICD-10-CM | POA: Diagnosis not present

## 2016-11-29 DIAGNOSIS — B9689 Other specified bacterial agents as the cause of diseases classified elsewhere: Secondary | ICD-10-CM

## 2016-11-29 DIAGNOSIS — O4690 Antepartum hemorrhage, unspecified, unspecified trimester: Secondary | ICD-10-CM

## 2016-11-29 DIAGNOSIS — N939 Abnormal uterine and vaginal bleeding, unspecified: Secondary | ICD-10-CM

## 2016-11-29 DIAGNOSIS — N898 Other specified noninflammatory disorders of vagina: Secondary | ICD-10-CM | POA: Insufficient documentation

## 2016-11-29 DIAGNOSIS — N760 Acute vaginitis: Secondary | ICD-10-CM

## 2016-11-29 DIAGNOSIS — I10 Essential (primary) hypertension: Secondary | ICD-10-CM | POA: Diagnosis not present

## 2016-11-29 LAB — URINALYSIS, ROUTINE W REFLEX MICROSCOPIC
Bilirubin Urine: NEGATIVE
GLUCOSE, UA: NEGATIVE mg/dL
KETONES UR: 15 mg/dL — AB
Nitrite: NEGATIVE
PROTEIN: NEGATIVE mg/dL
Specific Gravity, Urine: 1.02 (ref 1.005–1.030)
pH: 6 (ref 5.0–8.0)

## 2016-11-29 LAB — BASIC METABOLIC PANEL
ANION GAP: 8 (ref 5–15)
BUN: 13 mg/dL (ref 6–20)
CALCIUM: 9.3 mg/dL (ref 8.9–10.3)
CO2: 23 mmol/L (ref 22–32)
CREATININE: 0.67 mg/dL (ref 0.44–1.00)
Chloride: 103 mmol/L (ref 101–111)
GFR calc non Af Amer: >60 mL/min (ref >60)
Glucose, Bld: 91 mg/dL (ref 65–99)
Potassium: 3.7 mmol/L (ref 3.5–5.1)
Sodium: 134 mmol/L — ABNORMAL LOW (ref 135–145)

## 2016-11-29 LAB — CBC WITH DIFFERENTIAL/PLATELET
BASOS ABS: 0 10*3/uL (ref 0.0–0.1)
BASOS PCT: 0 %
EOS ABS: 0 10*3/uL (ref 0.0–0.7)
Eosinophils Relative: 0 %
HCT: 41.4 % (ref 36.0–46.0)
HEMOGLOBIN: 14.1 g/dL (ref 12.0–15.0)
Lymphocytes Relative: 25 %
Lymphs Abs: 2.4 10*3/uL (ref 0.7–4.0)
MCH: 29.3 pg (ref 26.0–34.0)
MCHC: 34.1 g/dL (ref 30.0–36.0)
MCV: 86.1 fL (ref 78.0–100.0)
MONO ABS: 0.7 10*3/uL (ref 0.1–1.0)
MONOS PCT: 7 %
NEUTROS PCT: 68 %
Neutro Abs: 6.4 10*3/uL (ref 1.7–7.7)
Platelets: 237 10*3/uL (ref 150–400)
RBC: 4.81 MIL/uL (ref 3.87–5.11)
RDW: 12.9 % (ref 11.5–15.5)
WBC: 9.6 10*3/uL (ref 4.0–10.5)

## 2016-11-29 LAB — WET PREP, GENITAL
Sperm: NONE SEEN
Trich, Wet Prep: NONE SEEN
Yeast Wet Prep HPF POC: NONE SEEN

## 2016-11-29 LAB — URINALYSIS, MICROSCOPIC (REFLEX)

## 2016-11-29 LAB — ABO/RH: ABO/RH(D): B POS

## 2016-11-29 LAB — HCG, QUANTITATIVE, PREGNANCY: HCG, BETA CHAIN, QUANT, S: 25830 m[IU]/mL — AB (ref <5)

## 2016-11-29 LAB — PREGNANCY, URINE: PREG TEST UR: POSITIVE — AB

## 2016-11-29 MED ORDER — METRONIDAZOLE 500 MG PO TABS: 500.0000 mg | ORAL_TABLET | Freq: Two times a day (BID) | ORAL | 0 refills | Status: DC

## 2016-11-29 NOTE — Discharge Instructions (Signed)
You were seen today with vaginal bleeding in early first trimester. Ultrasound so far looks normal. However no heart rate was seen yet because it is so early in pregnancy. You're also found to have bacterial vaginosis. We've given U treatment, metronidazole. Data shows that it is unlikely to harm vetus.   We will need a follow-up with your primary care physician, or OB to make sure that the fetus is developing normally.

## 2016-11-29 NOTE — ED Triage Notes (Signed)
Pt c/o brown discharge x 1 day  Pt is ? 6 weeks preg with positive home preg test

## 2016-11-29 NOTE — ED Provider Notes (Signed)
[facility] Provider Note   CSN: [id number] Arrival date & time: 11/29/16  1421     History   Chief Complaint Chief Complaint  Patient presents with  . Vaginal Bleeding    HPI Kristie Baker is a 20 y.o. female.  HPI    Patient is a 20 year old female presenting with 6 week pregnancy. Patient last menstrual period on November 5. Patient had one episode of dark brown spotting this morning. No cramping. Patient has history of one prior miscarriage at 9 weeks.  This was an intentional pregnancy. No other symptoms. No discharge, no urinary complaints.   Past Medical History:  Diagnosis Date  . Hypertension   . Hypertension 2012   Irregularities in BP; no longer on medication.  . Migraines   . PE (pulmonary embolism)     Patient Active Problem List   Diagnosis Date Noted  . Acute sinus infection 07/20/2012  . 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 Blurred vision, bilateral 07/19/2012  . Status migrainosus 07/19/2012    Past Surgical History:  Procedure Laterality Date  . ORTHOPEDIC SURGERY      OB History    Gravida Para Term Preterm AB Living   1             SAB TAB Ectopic Multiple Live Births                   Home Medications    Prior to Admission medications   Medication Sig Start Date End Date Taking? Authorizing Provider  cetirizine (ZYRTEC) 10 MG tablet Take 1 tablet (10 mg total) by mouth daily. 07/20/12 07/20/13  Roderick PeeMolly Rusin, MD  promethazine (PHENERGAN) 25 MG tablet Take 1 tablet (25 mg total) by mouth every 8 (eight) hours as needed for nausea or vomiting. 03/19/16   Charlestine Nighthristopher Lawyer, PA-C    Family History Family History  Problem Relation Age of Onset  . Asthma Brother   . Cancer Maternal Aunt   . Diabetes Maternal Grandmother     Social History Social History  Substance Use Topics  . Smoking status: Never Smoker  . Smokeless tobacco: Never Used  . Alcohol use No     Allergies   Amitriptyline and Cymbalta  [duloxetine hcl]   Review of Systems Review of Systems  Constitutional: Positive for fatigue. Negative for fever.  Respiratory: Negative for shortness of breath.   Gastrointestinal: Positive for nausea.     Physical Exam Updated Vital Signs BP 125/80 (BP Location: Left Arm)   Pulse 77   Temp 98.4 F (36.9 C) (Oral)   Resp 18   LMP 10/16/2016   SpO2 100%   Physical Exam  Constitutional: She is oriented to person, place, and time. She appears well-developed and well-nourished.  HENT:  Head: Normocephalic and atraumatic.  Eyes: Right eye exhibits no discharge.  Cardiovascular: Normal rate, regular rhythm and normal heart sounds.   No murmur heard. Pulmonary/Chest: Effort normal and breath sounds normal. She has no wheezes. She has no rales.  Abdominal: Soft. She exhibits no distension. There is no tenderness.  Genitourinary: Vagina normal.  Genitourinary Comments: Close cervix. No CMT. Mucus present at cervix  Neurological: She is oriented to person, place, and time.  Skin: Skin is warm and dry. She is not diaphoretic.  Psychiatric: She has a normal mood and affect.  Nursing note and vitals reviewed.    ED Treatments / Results  Labs (all labs ordered are listed, but only abnormal results are displayed) Labs Reviewed  URINALYSIS, ROUTINE W REFLEX MICROSCOPIC - Abnormal; Notable for the following:       Result Value   Hgb urine dipstick TRACE (*)    Ketones, ur 15 (*)    Leukocytes, UA TRACE (*)    All other components within normal limits  PREGNANCY, URINE - Abnormal; Notable for the following:    Preg Test, Ur POSITIVE (*)    All other components within normal limits  BASIC METABOLIC PANEL - Abnormal; Notable for the following:    Sodium 134 (*)    All other components within normal limits  URINALYSIS, MICROSCOPIC (REFLEX) - Abnormal; Notable for the following:    Bacteria, UA FEW (*)    Squamous Epithelial / LPF 0-5 (*)    All other components within normal  limits  WET PREP, GENITAL  CBC WITH DIFFERENTIAL/PLATELET  HCG, QUANTITATIVE, PREGNANCY  RPR  HIV ANTIBODY (ROUTINE TESTING)  ABO/RH  GC/CHLAMYDIA PROBE AMP ([place]) NOT AT [facility]    EKG  EKG Interpretation None       Radiology No results found.  Procedures Procedures (including critical care time)  Medications Ordered in ED Medications - No data to display   Initial Impression / Assessment and Plan / ED Course  I have reviewed the triage vital signs and the nursing notes.  Pertinent labs & imaging results that were available during my care of the patient were reviewed by me and considered in my medical decision making (see chart for details).  Clinical Course     Patient is a 20 year old female with 6 weeks pregnancy. Prior miscarriage. Dark brown spotting. Unlocated present pregnancy. Closed cervix. We'll get transvaginal ultrasound,, ABO Rh type sent.  TVUS shows sac, too early for heartbeat.. Will have ehr follow up with OBGYN. Clue cells for BV. According to uptodate, can treat with flagyl. Will have her follow up .  Threatened miscarriage     Final Clinical Impressions(s) / ED Diagnoses   Final diagnoses:  None    New Prescriptions New Prescriptions   No medications on file     Kristie Petrik Randall AnLyn Mohogany Toppins, MD 11/29/16 2211

## 2016-11-30 LAB — HIV ANTIBODY (ROUTINE TESTING W REFLEX): HIV Screen 4th Generation wRfx: NONREACTIVE

## 2016-11-30 LAB — RPR: RPR Ser Ql: NONREACTIVE

## 2016-11-30 LAB — GC/CHLAMYDIA PROBE AMP (~~LOC~~) NOT AT ARMC
CHLAMYDIA, DNA PROBE: NEGATIVE
NEISSERIA GONORRHEA: NEGATIVE

## 2016-12-23 ENCOUNTER — Emergency Department (HOSPITAL_BASED_OUTPATIENT_CLINIC_OR_DEPARTMENT_OTHER)
Admission: EM | Admit: 2016-12-23 | Discharge: 2016-12-23 | Disposition: A | Discharge status: Home / Self Care | Payer: Medicare HMO | Attending: Emergency Medicine | Admitting: Emergency Medicine

## 2016-12-23 ENCOUNTER — Encounter: Payer: Self-pay | Admitting: Emergency Medicine

## 2016-12-23 DIAGNOSIS — R1110 Vomiting, unspecified: Secondary | ICD-10-CM

## 2016-12-23 DIAGNOSIS — R05 Cough: Secondary | ICD-10-CM | POA: Diagnosis not present

## 2016-12-23 DIAGNOSIS — Z3491 Encounter for supervision of normal pregnancy, unspecified, first trimester: Secondary | ICD-10-CM

## 2016-12-23 DIAGNOSIS — O26891 Other specified pregnancy related conditions, first trimester: Secondary | ICD-10-CM | POA: Diagnosis present

## 2016-12-23 DIAGNOSIS — Z79899 Other long term (current) drug therapy: Secondary | ICD-10-CM | POA: Insufficient documentation

## 2016-12-23 DIAGNOSIS — I10 Essential (primary) hypertension: Secondary | ICD-10-CM | POA: Diagnosis not present

## 2016-12-23 DIAGNOSIS — O2311 Infections of bladder in pregnancy, first trimester: Secondary | ICD-10-CM | POA: Diagnosis not present

## 2016-12-23 DIAGNOSIS — Z3A09 9 weeks gestation of pregnancy: Secondary | ICD-10-CM | POA: Insufficient documentation

## 2016-12-23 DIAGNOSIS — R197 Diarrhea, unspecified: Secondary | ICD-10-CM | POA: Insufficient documentation

## 2016-12-23 DIAGNOSIS — N3000 Acute cystitis without hematuria: Secondary | ICD-10-CM

## 2016-12-23 LAB — CBC WITH DIFFERENTIAL/PLATELET
BASOS ABS: 0 10*3/uL (ref 0.0–0.1)
BASOS PCT: 0 %
Eosinophils Absolute: 0.1 10*3/uL (ref 0.0–0.7)
Eosinophils Relative: 1 %
HEMATOCRIT: 43.3 % (ref 36.0–46.0)
HEMOGLOBIN: 14.6 g/dL (ref 12.0–15.0)
Lymphocytes Relative: 22 %
Lymphs Abs: 2.2 10*3/uL (ref 0.7–4.0)
MCH: 29.3 pg (ref 26.0–34.0)
MCHC: 33.7 g/dL (ref 30.0–36.0)
MCV: 86.9 fL (ref 78.0–100.0)
MONOS PCT: 7 %
Monocytes Absolute: 0.6 10*3/uL (ref 0.1–1.0)
NEUTROS ABS: 6.8 10*3/uL (ref 1.7–7.7)
NEUTROS PCT: 70 %
Platelets: 232 10*3/uL (ref 150–400)
RBC: 4.98 MIL/uL (ref 3.87–5.11)
RDW: 12.9 % (ref 11.5–15.5)
WBC: 9.6 10*3/uL (ref 4.0–10.5)

## 2016-12-23 LAB — URINALYSIS, ROUTINE W REFLEX MICROSCOPIC
Bilirubin Urine: NEGATIVE
Glucose, UA: NEGATIVE mg/dL
Hgb urine dipstick: NEGATIVE
Ketones, ur: NEGATIVE mg/dL
Nitrite: NEGATIVE
PROTEIN: NEGATIVE mg/dL
Specific Gravity, Urine: 1.013 (ref 1.005–1.030)
pH: 6.5 (ref 5.0–8.0)

## 2016-12-23 LAB — COMPREHENSIVE METABOLIC PANEL
ALK PHOS: 40 U/L (ref 38–126)
ALT: 18 U/L (ref 14–54)
AST: 14 U/L — AB (ref 15–41)
Albumin: 4 g/dL (ref 3.5–5.0)
Anion gap: 6 (ref 5–15)
BILIRUBIN TOTAL: 0.4 mg/dL (ref 0.3–1.2)
BUN: 10 mg/dL (ref 6–20)
CALCIUM: 9.3 mg/dL (ref 8.9–10.3)
CO2: 26 mmol/L (ref 22–32)
Chloride: 105 mmol/L (ref 101–111)
Creatinine, Ser: 0.77 mg/dL (ref 0.44–1.00)
GFR calc Af Amer: >60 mL/min (ref >60)
GFR calc non Af Amer: >60 mL/min (ref >60)
GLUCOSE: 89 mg/dL (ref 65–99)
Potassium: 3.5 mmol/L (ref 3.5–5.1)
Sodium: 137 mmol/L (ref 135–145)
TOTAL PROTEIN: 7.1 g/dL (ref 6.5–8.1)

## 2016-12-23 LAB — URINALYSIS, MICROSCOPIC (REFLEX): RBC / HPF: NONE SEEN RBC/hpf (ref 0–5)

## 2016-12-23 LAB — LIPASE, BLOOD: Lipase: 25 U/L (ref 11–51)

## 2016-12-23 LAB — PREGNANCY, URINE: PREG TEST UR: POSITIVE — AB

## 2016-12-23 MED ORDER — SODIUM CHLORIDE 0.9 % IV BOLUS (SEPSIS)
1000.0000 mL | Freq: Once | INTRAVENOUS | Status: AC
  Administered 2016-12-23: 1000 mL via INTRAVENOUS

## 2016-12-23 MED ORDER — ONDANSETRON HCL 4 MG/2ML IJ SOLN
4.0000 mg | Freq: Once | INTRAMUSCULAR | Status: AC
  Administered 2016-12-23: 4 mg via INTRAVENOUS
  Filled 2016-12-23: qty 2

## 2016-12-23 MED ORDER — CEPHALEXIN 250 MG PO CAPS
500.0000 mg | ORAL_CAPSULE | Freq: Once | ORAL | Status: AC
  Administered 2016-12-23: 500 mg via ORAL
  Filled 2016-12-23: qty 2

## 2016-12-23 MED ORDER — CEPHALEXIN 500 MG PO CAPS: 500.0000 mg | ORAL_CAPSULE | Freq: Three times a day (TID) | ORAL | 0 refills | Status: DC

## 2016-12-23 NOTE — ED Triage Notes (Signed)
[redacted] weeks pregnant, was eval in OB office yesterday for 1st prenatal visit. Has diarrhea, cough, sweating, aching since yesterday.

## 2016-12-23 NOTE — ED Provider Notes (Signed)
[facility]   CSN: [id number] Arrival date & time: 12/23/16  1232     History   Chief Complaint Chief Complaint  Patient presents with  . Diarrhea  . Cough    HPI Kristie Baker is a 21 y.o. female hx of HTN, PE off anticoagulation, [redacted] weeks pregnant here presenting with vomiting, diarrhea. She States that she had her prenatal visit yesterday and ultrasound that confirmed a 9 week pregnancy. She had 4 episodes of diarrhea yesterday and was prescribed Phenergan, Zofran. States that she took Zofran once but did not like the taste and had persistent vomiting and diarrhea since then. Had occasional abdominal cramps but denies any abdominal pain or vaginal bleeding. Has occasional cough but denies any productive cough or fevers.    The history is provided by the patient.    Past Medical History:  Diagnosis Date  . Hypertension   . Hypertension 2012   Irregularities in BP; no longer on medication.  . Migraines   . PE (pulmonary embolism)     Patient Active Problem List   Diagnosis Date Noted  . Acute sinus infection 07/20/2012  . 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 Blurred vision, bilateral 07/19/2012  . Status migrainosus 07/19/2012    Past Surgical History:  Procedure Laterality Date  . ORTHOPEDIC SURGERY      OB History    Gravida Para Term Preterm AB Living   2             SAB TAB Ectopic Multiple Live Births                   Home Medications    Prior to Admission medications   Medication Sig Start Date End Date Taking? Authorizing Provider  Prenatal Vit-Fe Fumarate-FA (PRENATAL MULTIVITAMIN) TABS tablet Take 1 tablet by mouth daily at 12 noon.   Yes Historical Provider, MD  promethazine (PHENERGAN) 25 MG tablet Take 1 tablet (25 mg total) by mouth every 8 (eight) hours as needed for nausea or vomiting. 03/19/16  Yes Christopher Lawyer, PA-C  cetirizine (ZYRTEC) 10 MG tablet Take 1 tablet (10 mg total) by mouth daily. 07/20/12  07/20/13  Roderick Pee, MD  metroNIDAZOLE (FLAGYL) 500 MG tablet Take 1 tablet (500 mg total) by mouth 2 (two) times daily. 11/29/16   Courteney Lyn Mackuen, MD    Family History Family History  Problem Relation Age of Onset  . Asthma Brother   . Cancer Maternal Aunt   . Diabetes Maternal Grandmother     Social History Social History  Substance Use Topics  . Smoking status: Never Smoker  . Smokeless tobacco: Never Used  . Alcohol use No     Allergies   Amitriptyline and Cymbalta [duloxetine hcl]   Review of Systems Review of Systems  Respiratory: Positive for cough.   Gastrointestinal: Positive for diarrhea and vomiting.  All other systems reviewed and are negative.    Physical Exam Updated Vital Signs BP 127/81 (BP Location: Right Arm)   Pulse 80   Temp 98.5 F (36.9 C) (Oral)   Resp 20   Ht 5\' 5"  (1.651 m)   Wt 160 lb 8 oz (72.8 kg)   LMP 10/16/2016 (Exact Date) Comment: [redacted] weeks pregnant  SpO2 100%   BMI 26.71 kg/m   Physical Exam  Constitutional: She is oriented to person, place, and time. She appears well-developed.  HENT:  Head: Normocephalic.  MM slightly dry   Eyes: EOM are normal. Pupils are equal,  round, and reactive to light.  Neck: Normal range of motion. Neck supple.  Cardiovascular: Normal rate, regular rhythm and normal heart sounds.   Pulmonary/Chest: Effort normal and breath sounds normal. No respiratory distress. She has no wheezes. She has no rales.  Abdominal: Soft. Bowel sounds are normal.  Gravid uterus   Musculoskeletal: Normal range of motion.  Neurological: She is alert and oriented to person, place, and time. She displays normal reflexes. No cranial nerve deficit. Coordination normal.  Skin: Skin is warm.  Psychiatric: She has a normal mood and affect.  Nursing note and vitals reviewed.    ED Treatments / Results  Labs (all labs ordered are listed, but only abnormal results are displayed) Labs Reviewed  URINALYSIS, ROUTINE W  REFLEX MICROSCOPIC - Abnormal; Notable for the following:       Result Value   Leukocytes, UA MODERATE (*)    All other components within normal limits  PREGNANCY, URINE - Abnormal; Notable for the following:    Preg Test, Ur POSITIVE (*)    All other components within normal limits  COMPREHENSIVE METABOLIC PANEL - Abnormal; Notable for the following:    AST 14 (*)    All other components within normal limits  URINALYSIS, MICROSCOPIC (REFLEX) - Abnormal; Notable for the following:    Bacteria, UA MANY (*)    Squamous Epithelial / LPF 6-30 (*)    All other components within normal limits  CBC WITH DIFFERENTIAL/PLATELET  LIPASE, BLOOD    EKG  EKG Interpretation None       Radiology No results found.  Procedures Procedures (including critical care time)  Medications Ordered in ED Medications  cephALEXin (KEFLEX) capsule 500 mg (not administered)  sodium chloride 0.9 % bolus 1,000 mL (1,000 mLs Intravenous New Bag/Given 12/23/16 1344)  ondansetron (ZOFRAN) injection 4 mg (4 mg Intravenous Given 12/23/16 1344)     Initial Impression / Assessment and Plan / ED Course  I have reviewed the triage vital signs and the nursing notes.  Pertinent labs & imaging results that were available during my care of the patient were reviewed by me and considered in my medical decision making (see chart for details).  Clinical Course     Kristie Baker is a 21 y.o. female here with vomiting, diarrhea. Patient is [redacted] weeks pregnant, no vaginal bleeding, had prenatal visit yesterday with OB. She is taking zofran but still vomiting. Will check labs, UA. Will hydrate and reassess. No signs of miscarriage.   3:06 PM UA + leuk, + bacteria. WBC nl, chemistry nl. I think UA likely contaminated from the gastro but since she is pregnant, treating bacteriuria may prevent miscarriage so will give keflex for 5 days. She has zofran and phenergan at home and felt better with IVF. Will dc home.    Final  Clinical Impressions(s) / ED Diagnoses   Final diagnoses:  None    New Prescriptions New Prescriptions   No medications on file     Charlynne Panderavid Hsienta Brenlyn Beshara, MD 12/23/16 1507

## 2016-12-23 NOTE — Discharge Instructions (Signed)
Continue zofran, phenergan as prescribed by your [facility].   Take keflex three times daily for 5 days for UTI.   Take tylenol as needed for abdominal pain or headaches.   See your OB doctor.   You can take imodium as needed for diarrhea  Return to ER if you have severe abdominal pain, worse vomiting, fever, vaginal bleeding, dehydration.

## 2016-12-25 LAB — URINE CULTURE

## 2017-03-03 IMAGING — CT CT ABD-PELV W/ CM
2 of 4 series · 16 of 46 positions shown, 18 images · IV contrast (APPLIED)
Comparison: None.

CLINICAL DATA: Bilateral lower abdominal pain for several days.
Recent vaginal yeast infection.

EXAM:
CT ABDOMEN AND PELVIS WITH CONTRAST
TECHNIQUE: Multidetector CT imaging of the abdomen and pelvis was performed
using the standard protocol following bolus administration of
intravenous contrast.
CONTRAST:  100mL [id number] IOPAMIDOL ([id number]) INJECTION 61%

[Series 2: axial st · axial · 0.94mm/px · z∈[-484,-50]mm · 13 of 95 slices shown, 15 images]
[im 4/95  soft-tissue]
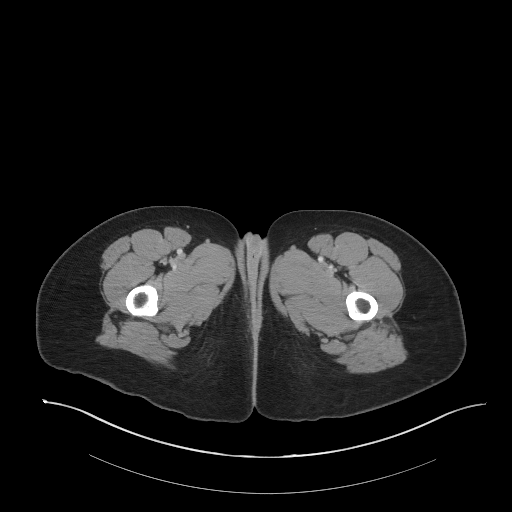
[im 4/95  bone]
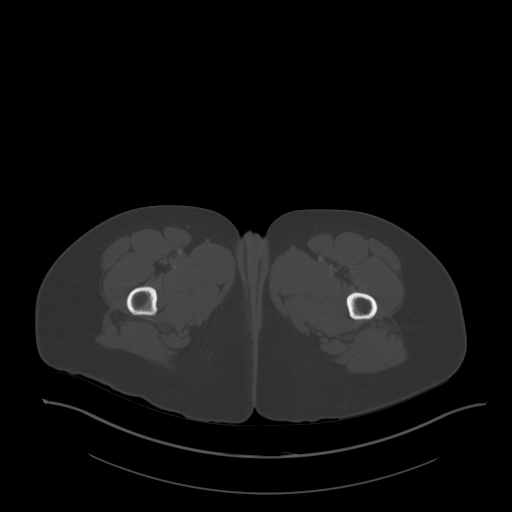
[im 12/95  soft-tissue]
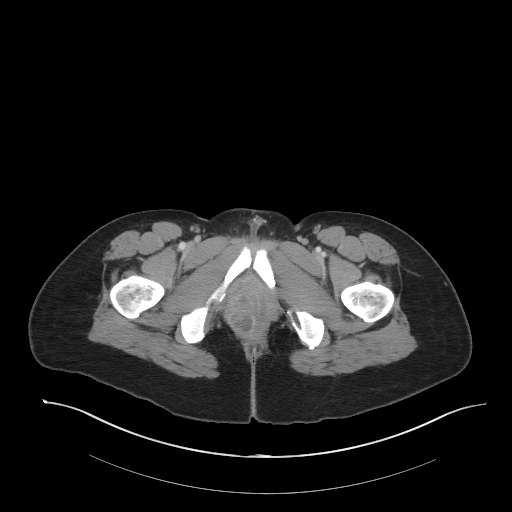
[im 19/95  soft-tissue]
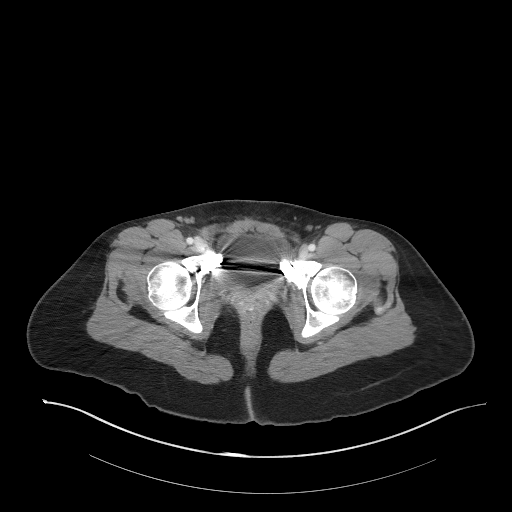
[im 27/95  soft-tissue]
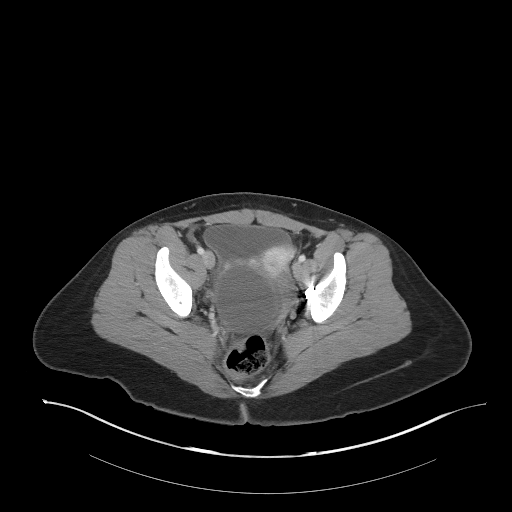
[im 34/95  soft-tissue]
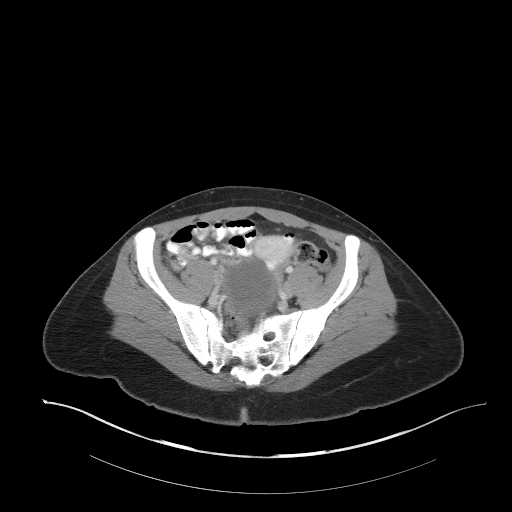
[im 42/95  soft-tissue]
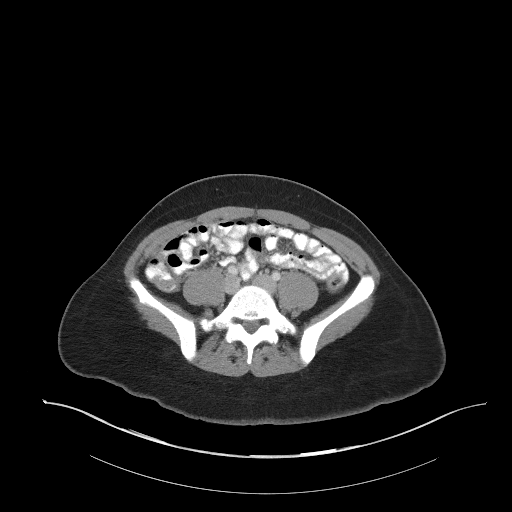
[im 49/95  soft-tissue]
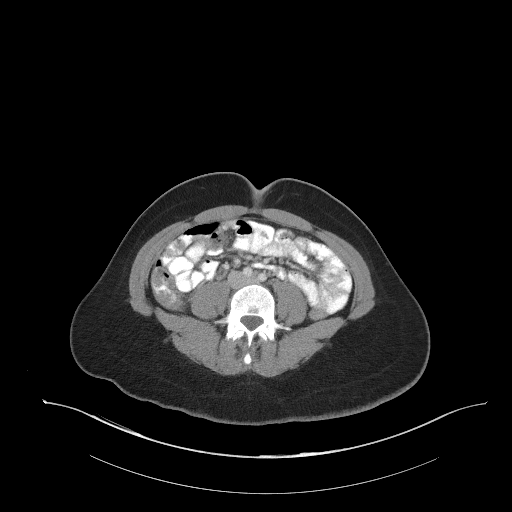
[im 53/95  soft-tissue]
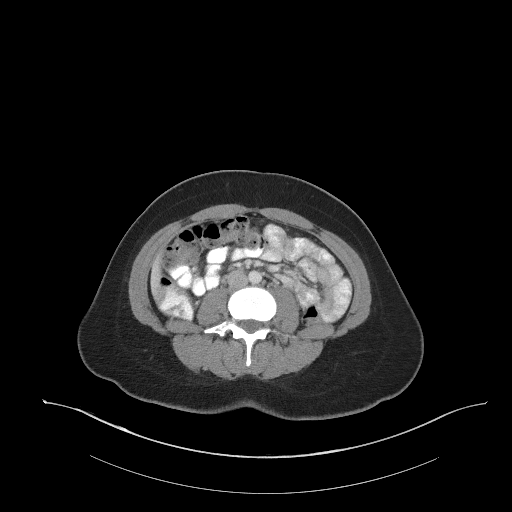
[im 61/95  soft-tissue]
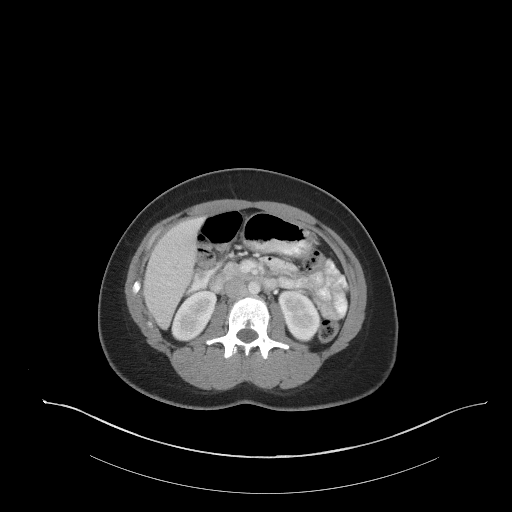
[im 61/95  bone]
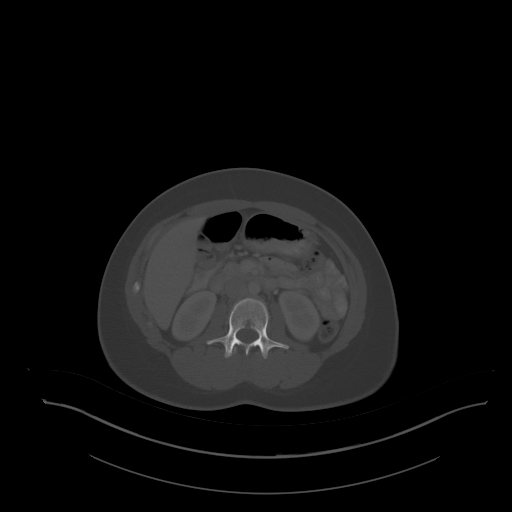
[im 68/95  soft-tissue]
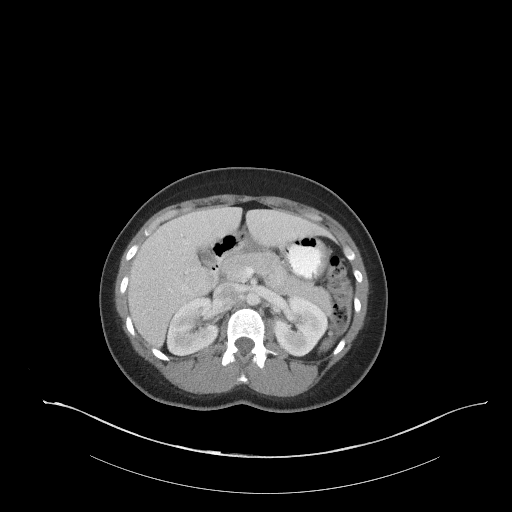
[im 76/95  soft-tissue]
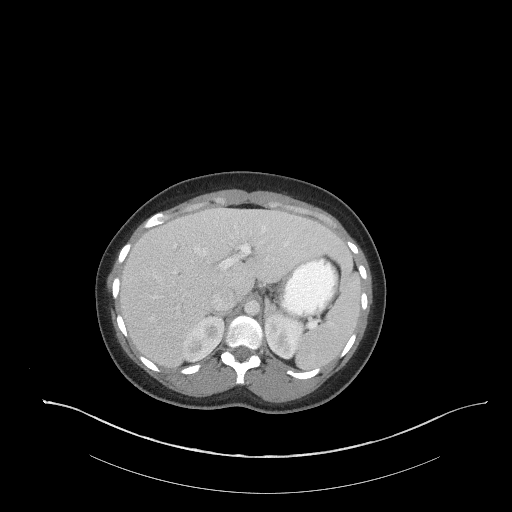
[im 83/95  soft-tissue]
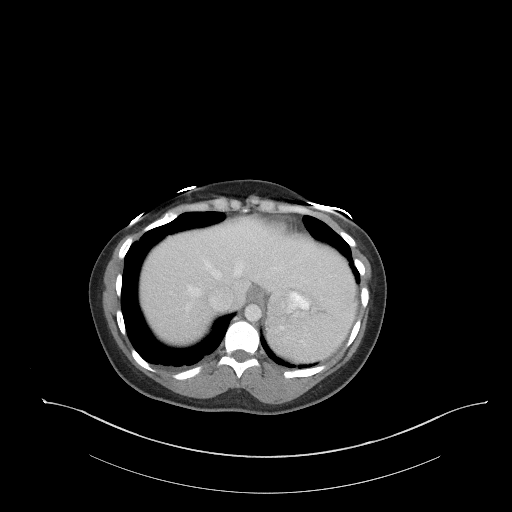
[im 91/95  soft-tissue]
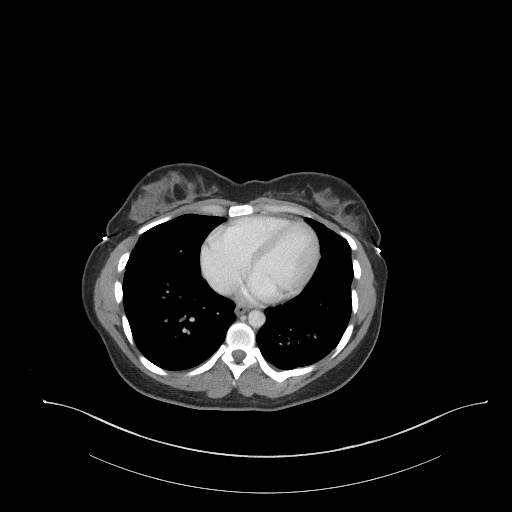

[Series 5: coronal st · coronal · 0.80mm/px · 3 of 73 slices shown]
[im 25/73  soft-tissue]
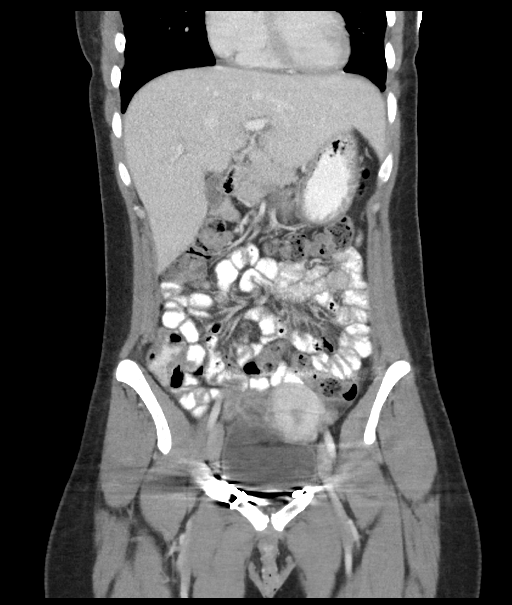
[im 33/73  soft-tissue]
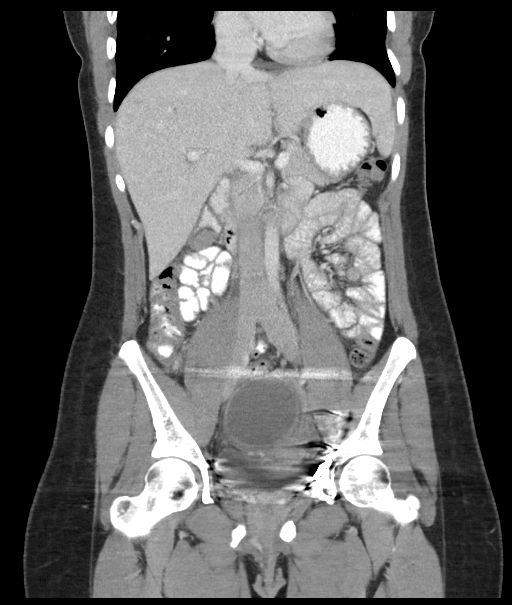
[im 41/73  soft-tissue]
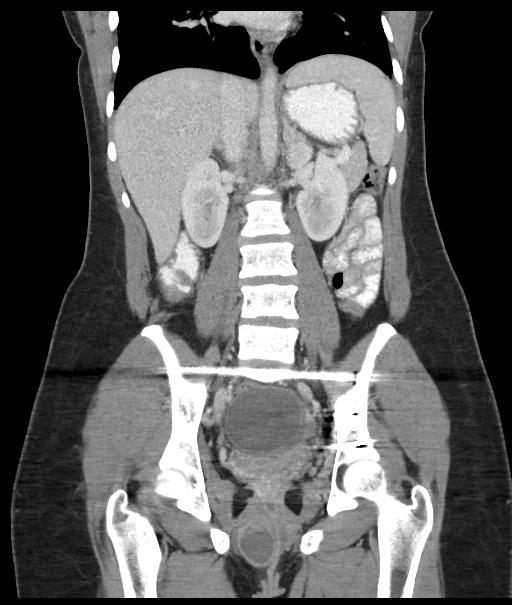

[16 of 46 positions shown; findings below may reference images not displayed]

FINDINGS: Minimal ground-glass opacity in the right middle lobe on image 5 of
series 4, likely focal atelectasis. No suspicious infiltrates are
seen in the lungs. There is a tiny effusion on the right however.
The lung bases are otherwise within normal limits.

No free air. Trace free fluid in the pelvis. The liver and portal
vein are normal. Layering stones or wall thickening in the posterior
gallbladder on axial image 30 aa. Possible enhancement of
gallbladder wall on image 33 of the axial images. Trace fluid
adjacent to the gallbladder is not excluded on these images. The
spleen, adrenal glands, pancreas, and kidneys are normal. The
abdominal aorta is normal in caliber with no dissection. No
adenopathy. The stomach is normal in appearance. The small bowel is
normal as well. The colon and visualized appendix are normal. No
evidence of appendicitis. No adenopathy.

There is a large cystic mass in the pelvis, displacing the uterus to
the left and anteriorly. The mass measures 7.4 x 6.5 cm on series 2,
image 66. A separate right ovary is not seen. The left ovary is
probably just posterior to the uterus on image 69. The bladder is
normal. No adenopathy. Trace free fluid in the pelvis. A complicated
collection is seen in the right labia, most consistent with a
complicated Bartholin cyst. A similar but smaller finding is seen on
the left on axial image 87. The larger right collection measures up
to 4.4 by 2.9 cm.

Surgical hardware is seen in the pelvis. Healed rib fractures are
seen on the left. No acute bony abnormalities identified.
IMPRESSION: 1. There is a large cystic mass in the pelvis, likely arising from
the right ovary, measuring up to 7.4 cm. A pelvic ultrasound could
better evaluate.
2. Bartholin Cysts in the labia. The right Barhtolin Cyst is large,
measuring 4.4 cm, and complicated. Recommend clinical correlation.
3. The gallbladder may contain stones versus is polyps or a
thickened wall. There may be a small amount of pericholecystic fluid
and a small amount of wall enhancement is not excluded. A right
upper quadrant ultrasound could better evaluate the gallbladder.

## 2017-10-12 ENCOUNTER — Other Ambulatory Visit: Payer: Self-pay | Admitting: Certified Nurse Midwife

## 2020-03-08 ENCOUNTER — Encounter (HOSPITAL_BASED_OUTPATIENT_CLINIC_OR_DEPARTMENT_OTHER): Payer: Self-pay

## 2020-03-08 ENCOUNTER — Emergency Department (HOSPITAL_BASED_OUTPATIENT_CLINIC_OR_DEPARTMENT_OTHER): Payer: Medicare HMO

## 2020-03-08 ENCOUNTER — Emergency Department (HOSPITAL_BASED_OUTPATIENT_CLINIC_OR_DEPARTMENT_OTHER)
Admission: EM | Admit: 2020-03-08 | Discharge: 2020-03-08 | Disposition: A | Discharge status: Home / Self Care | Payer: Medicare HMO | Attending: Emergency Medicine | Admitting: Emergency Medicine

## 2020-03-08 ENCOUNTER — Other Ambulatory Visit: Payer: Self-pay

## 2020-03-08 DIAGNOSIS — I10 Essential (primary) hypertension: Secondary | ICD-10-CM | POA: Insufficient documentation

## 2020-03-08 DIAGNOSIS — I2699 Other pulmonary embolism without acute cor pulmonale: Secondary | ICD-10-CM | POA: Diagnosis not present

## 2020-03-08 DIAGNOSIS — R112 Nausea with vomiting, unspecified: Secondary | ICD-10-CM | POA: Insufficient documentation

## 2020-03-08 DIAGNOSIS — R519 Headache, unspecified: Secondary | ICD-10-CM | POA: Insufficient documentation

## 2020-03-08 DIAGNOSIS — R55 Syncope and collapse: Secondary | ICD-10-CM | POA: Diagnosis not present

## 2020-03-08 LAB — COMPREHENSIVE METABOLIC PANEL
ALT: 17 U/L (ref 0–44)
AST: 18 U/L (ref 15–41)
Albumin: 4.3 g/dL (ref 3.5–5.0)
Alkaline Phosphatase: 61 U/L (ref 38–126)
Anion gap: 9 (ref 5–15)
BUN: 11 mg/dL (ref 6–20)
CO2: 25 mmol/L (ref 22–32)
Calcium: 9.1 mg/dL (ref 8.9–10.3)
Chloride: 104 mmol/L (ref 98–111)
Creatinine, Ser: 0.68 mg/dL (ref 0.44–1.00)
GFR calc Af Amer: >60 mL/min (ref >60)
GFR calc non Af Amer: >60 mL/min (ref >60)
Glucose, Bld: 115 mg/dL — ABNORMAL HIGH (ref 70–99)
Potassium: 4 mmol/L (ref 3.5–5.1)
Sodium: 138 mmol/L (ref 135–145)
Total Bilirubin: 0.8 mg/dL (ref 0.3–1.2)
Total Protein: 7.4 g/dL (ref 6.5–8.1)

## 2020-03-08 LAB — CBC WITH DIFFERENTIAL/PLATELET
Abs Immature Granulocytes: 0.02 10*3/uL (ref 0.00–0.07)
Basophils Absolute: 0 10*3/uL (ref 0.0–0.1)
Basophils Relative: 0 %
Eosinophils Absolute: 0.1 10*3/uL (ref 0.0–0.5)
Eosinophils Relative: 1 %
HCT: 44.6 % (ref 36.0–46.0)
Hemoglobin: 14.3 g/dL (ref 12.0–15.0)
Immature Granulocytes: 0 %
Lymphocytes Relative: 27 %
Lymphs Abs: 2.6 10*3/uL (ref 0.7–4.0)
MCH: 28.3 pg (ref 26.0–34.0)
MCHC: 32.1 g/dL (ref 30.0–36.0)
MCV: 88.1 fL (ref 80.0–100.0)
Monocytes Absolute: 0.4 10*3/uL (ref 0.1–1.0)
Monocytes Relative: 5 %
Neutro Abs: 6.4 10*3/uL (ref 1.7–7.7)
Neutrophils Relative %: 67 %
Platelets: 223 10*3/uL (ref 150–400)
RBC: 5.06 MIL/uL (ref 3.87–5.11)
RDW: 12.7 % (ref 11.5–15.5)
WBC: 9.5 10*3/uL (ref 4.0–10.5)
nRBC: 0 % (ref 0.0–0.2)

## 2020-03-08 LAB — URINALYSIS, ROUTINE W REFLEX MICROSCOPIC
Bilirubin Urine: NEGATIVE
Glucose, UA: NEGATIVE mg/dL
Hgb urine dipstick: NEGATIVE
Ketones, ur: NEGATIVE mg/dL
Leukocytes,Ua: NEGATIVE
Nitrite: NEGATIVE
Protein, ur: NEGATIVE mg/dL
Specific Gravity, Urine: >1.03 — ABNORMAL HIGH (ref 1.005–1.030)
pH: 6 (ref 5.0–8.0)

## 2020-03-08 LAB — LIPASE, BLOOD: Lipase: 32 U/L (ref 11–51)

## 2020-03-08 LAB — PREGNANCY, URINE: Preg Test, Ur: NEGATIVE

## 2020-03-08 MED ORDER — SODIUM CHLORIDE 0.9 % IV BOLUS
1000.0000 mL | Freq: Once | INTRAVENOUS | Status: AC
  Administered 2020-03-08: 1000 mL via INTRAVENOUS

## 2020-03-08 MED ORDER — ONDANSETRON 4 MG PO TBDP: 4.0000 mg | ORAL_TABLET | Freq: Three times a day (TID) | ORAL | 0 refills | Status: DC | PRN

## 2020-03-08 MED ORDER — PROCHLORPERAZINE EDISYLATE 10 MG/2ML IJ SOLN
10.0000 mg | Freq: Once | INTRAMUSCULAR | Status: AC
  Administered 2020-03-08: 10 mg via INTRAVENOUS
  Filled 2020-03-08: qty 2

## 2020-03-08 MED ORDER — IOHEXOL 350 MG/ML SOLN
100.0000 mL | Freq: Once | INTRAVENOUS | Status: AC | PRN
  Administered 2020-03-08: 14:00:00 100 mL via INTRAVENOUS

## 2020-03-08 MED ORDER — DIPHENHYDRAMINE HCL 50 MG/ML IJ SOLN
25.0000 mg | Freq: Once | INTRAMUSCULAR | Status: AC
  Administered 2020-03-08: 25 mg via INTRAVENOUS
  Filled 2020-03-08: qty 1

## 2020-03-08 NOTE — ED Provider Notes (Signed)
[facility] EMERGENCY DEPARTMENT Provider Note   CSN: [id number] Arrival date & time: 03/08/20  1022     History Chief Complaint  Patient presents with  . Emesis    Kristie Baker is a 24 y.o. female with past medical history significant for migraines, PE after,, hypertension on any medication who presents for evaluation of emesis.  Patient has history of chronic headaches.  Patient states today she woke up with headache which she rates a 7 on 10.  Denies any sudden onset thunderclap headache.  Has had some minor bilateral neck pain however no stiffness or rigidity.  Patient states she was at work and had multiple episodes of emesis.  Patient states she about to have another additional emesis when she felt dizzy, felt "numb all over" and like her vision went dark.  She did not have a syncopal episode.  Patient states she is able to sit and felt improved.  She was noted to have a blood pressure 100/50.  She did a pregnancy test at work which was negative.  States headache feels similar" to her prior migraines.  No recent injury or trauma recently however does have remote history of head trauma.  Denies any facial droop, difficulty with word finding, unilateral weakness or paresthesias.  She is not take anything for symptoms.  Denies chest pain, shortness of breath, abdominal pain.  States she did have 2 episodes of diarrhea as well.  No melena or bright red blood per rectum.  No recent fever or chills.  No sick contacts.  Denies history aggravating or alleviating factors.  History obtained from patient and past medical record.  No interpreter is used.  HPI     Past Medical History:  Diagnosis Date  . Hypertension   . Hypertension 2012   Irregularities in BP; no longer on medication.  . Migraines   . PE (pulmonary embolism)     Patient Active Problem List   Diagnosis Date Noted  . Acute sinus infection 07/20/2012  . 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 Blurred vision,  bilateral 07/19/2012  . Status migrainosus 07/19/2012    Past Surgical History:  Procedure Laterality Date  . ORTHOPEDIC SURGERY       OB History    Gravida  2   Para      Term      Preterm      AB      Living        SAB      TAB      Ectopic      Multiple      Live Births              Family History  Problem Relation Age of Onset  . Asthma Brother   . Cancer Maternal Aunt   . Diabetes Maternal Grandmother     Social History   Tobacco Use  . Smoking status: Never Smoker  . Smokeless tobacco: Never Used  Substance Use Topics  . Alcohol use: No  . Drug use: No    Home Medications Prior to Admission medications   Medication Sig Start Date End Date Taking? Authorizing Provider  cephALEXin (KEFLEX) 500 MG capsule Take 1 capsule (500 mg total) by mouth 3 (three) times daily. 12/23/16   Charlynne Pander, MD  cetirizine (ZYRTEC) 10 MG tablet Take 1 tablet (10 mg total) by mouth daily. 07/20/12 07/20/13  Roderick Pee, MD  metroNIDAZOLE (FLAGYL) 500 MG tablet Take 1 tablet (500 mg total) by  mouth 2 (two) times daily. 11/29/16   Mackuen, Courteney Lyn, MD  ondansetron (ZOFRAN ODT) 4 MG disintegrating tablet Take 1 tablet (4 mg total) by mouth every 8 (eight) hours as needed for nausea or vomiting. 03/08/20   Teddy Rebstock A, PA-C  Prenatal Vit-Fe Fumarate-FA (PRENATAL MULTIVITAMIN) TABS tablet Take 1 tablet by mouth daily at 12 noon.    [provider]  promethazine (PHENERGAN) 25 MG tablet Take 1 tablet (25 mg total) by mouth every 8 (eight) hours as needed for nausea or vomiting. 03/19/16   Lawyer, Cristal Deer, PA-C    Allergies    Amitriptyline and Cymbalta [duloxetine hcl]  Review of Systems   Review of Systems  Constitutional: Positive for activity change, appetite change and fatigue. Negative for chills, diaphoresis and fever.  HENT: Negative.   Eyes: Negative.   Respiratory: Negative.   Cardiovascular: Negative.   Gastrointestinal:  Positive for diarrhea, nausea and vomiting. Negative for abdominal distention, abdominal pain, anal bleeding, blood in stool, constipation and rectal pain.  Genitourinary: Negative.   Musculoskeletal: Positive for neck pain (Bilateral neck pain). Negative for arthralgias, back pain, gait problem, joint swelling, myalgias and neck stiffness.  Skin: Negative.   Neurological: Positive for dizziness, weakness (Generalized), light-headedness, numbness and headaches. Negative for seizures, syncope, facial asymmetry and speech difficulty.  All other systems reviewed and are negative.  Physical Exam Updated Vital Signs BP 103/62   Pulse 100   Temp 99 F (37.2 C) (Oral)   Resp 15   Ht 5\' 5"  (1.651 m)   Wt 85.5 kg   SpO2 95%   Breastfeeding Unknown   BMI 31.35 kg/m   Physical Exam Physical Exam  Constitutional: Pt is oriented to person, place, and time. Pt appears well-developed and well-nourished. No distress.  HENT:  Head: Normocephalic and atraumatic.  Mouth/Throat: Oropharynx is clear and moist.  Eyes: Conjunctivae and EOM are normal. Pupils are equal, round, and reactive to light. No scleral icterus.  No horizontal, vertical or rotational nystagmus  Neck: Normal range of motion. Neck supple.  Full active and passive ROM without pain No midline or paraspinal tenderness No nuchal rigidity or meningeal signs  Cardiovascular: Normal rate, regular rhythm and intact distal pulses.   Pulmonary/Chest: Effort normal and breath sounds normal. No respiratory distress. Pt has no wheezes. No rales.  Abdominal: Soft. Bowel sounds are normal. There is no tenderness. There is no rebound and no guarding.  Musculoskeletal: Normal range of motion.  Lymphadenopathy:    No cervical adenopathy.  Neurological: Pt. is alert and oriented to person, place, and time. He has normal reflexes. No cranial nerve deficit.  Exhibits normal muscle tone. Coordination normal.  Mental Status:  Alert, oriented, thought  content appropriate. Speech fluent without evidence of aphasia. Able to follow 2 step commands without difficulty.  Cranial Nerves:  II:  Peripheral visual fields grossly normal, pupils equal, round, reactive to light III,IV, VI: ptosis not present, extra-ocular motions intact bilaterally  V,VII: smile symmetric, facial light touch sensation equal VIII: hearing grossly normal bilaterally  IX,X: midline uvula rise  XI: bilateral shoulder shrug equal and strong XII: midline tongue extension  Motor:  5/5 in upper and lower extremities bilaterally including strong and equal grip strength and dorsiflexion/plantar flexion Sensory: Pinprick and light touch normal in all extremities.  Deep Tendon Reflexes: 2+ and symmetric  Cerebellar: normal finger-to-nose with bilateral upper extremities Gait: normal gait and balance CV: distal pulses palpable throughout   Skin: Skin is warm and dry.  No rash noted. Pt is not diaphoretic.  Psychiatric: Pt has a normal mood and affect. Behavior is normal. Judgment and thought content normal.  Nursing note and vitals reviewed. ED Results / Procedures / Treatments   Labs (all labs ordered are listed, but only abnormal results are displayed) Labs Reviewed  COMPREHENSIVE METABOLIC PANEL - Abnormal; Notable for the following components:      Result Value   Glucose, Bld 115 (*)    All other components within normal limits  URINALYSIS, ROUTINE W REFLEX MICROSCOPIC - Abnormal; Notable for the following components:   APPearance CLOUDY (*)    Specific Gravity, Urine >1.030 (*)    All other components within normal limits  PREGNANCY, URINE  CBC WITH DIFFERENTIAL/PLATELET  LIPASE, BLOOD    EKG EKG Interpretation  Date/Time:  Monday March 08 2020 13:11:01 EDT Ventricular Rate:  93 PR Interval:    QRS Duration: 88 QT Interval:  365 QTC Calculation: 454 R Axis:   65 Text Interpretation: Sinus rhythm No significant change since prior 8/17 Confirmed by Meridee Score [id number]) on 03/08/2020 1:14:27 PM   Radiology CT Angio Head W or Wo Contrast  Result Date: 03/08/2020 CLINICAL DATA:  Neck pain, acute. Bilateral numbness. Acute onset of dizziness with nausea and vomiting beginning 7 hours ago. EXAM: CT ANGIOGRAPHY HEAD AND NECK TECHNIQUE: Multidetector CT imaging of the head and neck was performed using the standard protocol during bolus administration of intravenous contrast. Multiplanar CT image reconstructions and MIPs were obtained to evaluate the vascular anatomy. Carotid stenosis measurements (when applicable) are obtained utilizing NASCET criteria, using the distal internal carotid diameter as the denominator. CONTRAST:  OMNIPAQUE IOHEXOL 350 MG/ML SOLN COMPARISON:  CT head without contrast 07/18/2012 and MR head without contrast 07/19/2012. FINDINGS: CT HEAD FINDINGS Brain: No acute infarct, hemorrhage, or mass lesion is present. The ventricles are of normal size. No significant extraaxial fluid collection is present. The brainstem and cerebellum are within normal limits. Vascular: No hyperdense vessel or unexpected calcification. Skull: Calvarium is intact. No focal lytic or blastic lesions are present. No significant extracranial soft tissue lesion is present. Sinuses: The paranasal sinuses and mastoid air cells are clear. The globes and orbits are within normal limits. Orbits: The globes and orbits are within normal limits. Review of the MIP images confirms the above findings CTA NECK FINDINGS Aortic arch: A common origin of the left common carotid artery in the innominate artery is noted. Aortic arch is otherwise normal. No significant atherosclerotic changes, stenosis, or aneurysm is present. Right carotid system: The right common carotid artery is within normal limits. Bifurcation is unremarkable. The cervical right ICA is normal. Left carotid system: The left common carotid artery is within normal limits. The bifurcation is unremarkable. The  cervical left ICA is normal. Vertebral arteries: The vertebral arteries are codominant. Both vertebral arteries originate from the subclavian arteries without significant stenosis. There is no significant stenosis of either vertebral artery in the neck. Skeleton: Vertebral body heights and alignment are normal. No focal lytic or blastic lesions are present. There is some straightening of the normal cervical lordosis. Other neck: Soft tissues the neck are unremarkable. Upper chest: The lung apices are clear.  Thoracic inlet is normal. Review of the MIP images confirms the above findings CTA HEAD FINDINGS Anterior circulation: The internal carotid arteries are within normal limits from the high cervical segments through the ICA termini. The A1 and M1 segments are normal. The anterior communicating artery is not discretely visualized.  The ACA and MCA branch vessels are within normal limits. Posterior circulation: The vertebral arteries are codominant. The PICA origins are visualized and normal. The vertebrobasilar junction is normal. Both posterior cerebral arteries originate from basilar tip. PCA branch vessels are within normal limits. Venous sinuses: The dural sinuses are patent. The straight sinus and deep cerebral veins are patent. Cortical veins are within normal limits. Anatomic variants: None Review of the MIP images confirms the above findings IMPRESSION: 1. Normal CT appearance of the head without contrast 2. Normal CTA of the neck. 3. Normal CTA of the head. Electronically Signed   By: Marin Robertshristopher  Mattern M.D.   On: 03/08/2020 14:25   CT Angio Neck W and/or Wo Contrast  Result Date: 03/08/2020 CLINICAL DATA:  Neck pain, acute. Bilateral numbness. Acute onset of dizziness with nausea and vomiting beginning 7 hours ago. EXAM: CT ANGIOGRAPHY HEAD AND NECK TECHNIQUE: Multidetector CT imaging of the head and neck was performed using the standard protocol during bolus administration of intravenous contrast.  Multiplanar CT image reconstructions and MIPs were obtained to evaluate the vascular anatomy. Carotid stenosis measurements (when applicable) are obtained utilizing NASCET criteria, using the distal internal carotid diameter as the denominator. CONTRAST:  100mL OMNIPAQUE IOHEXOL 350 MG/ML SOLN COMPARISON:  CT head without contrast 07/18/2012 and MR head without contrast 07/19/2012. FINDINGS: CT HEAD FINDINGS Brain: No acute infarct, hemorrhage, or mass lesion is present. The ventricles are of normal size. No significant extraaxial fluid collection is present. The brainstem and cerebellum are within normal limits. Vascular: No hyperdense vessel or unexpected calcification. Skull: Calvarium is intact. No focal lytic or blastic lesions are present. No significant extracranial soft tissue lesion is present. Sinuses: The paranasal sinuses and mastoid air cells are clear. The globes and orbits are within normal limits. Orbits: The globes and orbits are within normal limits. Review of the MIP images confirms the above findings CTA NECK FINDINGS Aortic arch: A common origin of the left common carotid artery in the innominate artery is noted. Aortic arch is otherwise normal. No significant atherosclerotic changes, stenosis, or aneurysm is present. Right carotid system: The right common carotid artery is within normal limits. Bifurcation is unremarkable. The cervical right ICA is normal. Left carotid system: The left common carotid artery is within normal limits. The bifurcation is unremarkable. The cervical left ICA is normal. Vertebral arteries: The vertebral arteries are codominant. Both vertebral arteries originate from the subclavian arteries without significant stenosis. There is no significant stenosis of either vertebral artery in the neck. Skeleton: Vertebral body heights and alignment are normal. No focal lytic or blastic lesions are present. There is some straightening of the normal cervical lordosis. Other neck:  Soft tissues the neck are unremarkable. Upper chest: The lung apices are clear.  Thoracic inlet is normal. Review of the MIP images confirms the above findings CTA HEAD FINDINGS Anterior circulation: The internal carotid arteries are within normal limits from the high cervical segments through the ICA termini. The A1 and M1 segments are normal. The anterior communicating artery is not discretely visualized. The ACA and MCA branch vessels are within normal limits. Posterior circulation: The vertebral arteries are codominant. The PICA origins are visualized and normal. The vertebrobasilar junction is normal. Both posterior cerebral arteries originate from basilar tip. PCA branch vessels are within normal limits. Venous sinuses: The dural sinuses are patent. The straight sinus and deep cerebral veins are patent. Cortical veins are within normal limits. Anatomic variants: None Review of the MIP images confirms  the above findings IMPRESSION: 1. Normal CT appearance of the head without contrast 2. Normal CTA of the neck. 3. Normal CTA of the head. Electronically Signed   By: Marin Roberts M.D.   On: 03/08/2020 14:25    Procedures Procedures (including critical care time)  Medications Ordered in ED Medications  sodium chloride 0.9 % bolus 1,000 mL ( Intravenous Stopped 03/08/20 1436)  prochlorperazine (COMPAZINE) injection 10 mg (10 mg Intravenous Given 03/08/20 1307)  diphenhydrAMINE (BENADRYL) injection 25 mg (25 mg Intravenous Given 03/08/20 1305)  iohexol (OMNIPAQUE) 350 MG/ML injection 100 mL (100 mLs Intravenous Contrast Given 03/08/20 1400)    ED Course  I have reviewed the triage vital signs and the nursing notes.  Pertinent labs & imaging results that were available during my care of the patient were reviewed by me and considered in my medical decision making (see chart for details).  24 year old female presents for evaluation of emesis.  She is afebrile, nonseptic, not ill-appearing.   Apparently had headache which began at the same time.  Was at work and had multiple episodes of emesis.  Patient states she had a episode where she is about to have an additional episode of emesis where she felt "numb all over" and near syncopal event however did not syncopized.  She denies any unilateral weakness, unilateral paresthesias, difficulty with word finding or facial droop.  Patient also with diarrhea without abdominal pain at work.  She has a nonfocal neurologic exam without deficits.  Admits to some bilateral trapezius pain however no midline neck pain.  Patient states at that time having syncopal event she felt like her vision went "black".  She denies any visual field cuts, diplopia or any pain.  Has history of migraine with aura.  Plan on labs, imaging and reevaluate.  Labs and imaging personally reviewed and interpreted: CBC without leukocytosis, hemoglobin stable Lipase 32 Metabolic panel with mild hyperglycemia to 115 however no additional direct, renal or normality UA negative for infection Pregnancy test negative CTA head and neck negative for acute pathology EKG without changes   1435: Has told nursing staff she wants to go home.  1437: Went to reassess patient.  States her symptoms mildly improved.  She would like discharge at this time.  Discussed with patient and like to p.o. challenge, orthostatic VS and get her symptoms more under control.  She declines anything additional at this time and is requesting discharge. Ambulatory in room without Ataxic gait. Patient states that if she is not discharged she will leave AGAINST MEDICAL ADVICE.   Labs and imaging reassuring.  Ambulatory with out difficulty.  Continued nonfocal neurologic exam without deficits.  We will discharge patient per her request and have her return for any worsening symptoms.  Low suspicion for acute CVA, dissection, acute neurologic, vascular, abd, bacterial emergency.   Patient does not meet the SIRS or  Sepsis criteria.  On repeat exam patient does not have a surgical abdomin and there are no peritoneal signs.  No indication of appendicitis, bowel obstruction, bowel perforation, cholecystitis, diverticulitis, PID or ectopic pregnancy.   Presentation is like pts typical HA and non concerning for [facility], ICH, Meningitis, or temporal arteritis. Pt is afebrile with no focal neuro deficits, nuchal rigidity, or change in vision.   The patient has been appropriately medically screened and/or stabilized in the ED. I have low suspicion for any other emergent medical condition which would require further screening, evaluation or treatment in the ED or require inpatient management.  Patient is hemodynamically stable and in no acute distress.  Patient able to ambulate in department prior to ED.  Evaluation does not show acute pathology that would require ongoing or additional emergent interventions while in the emergency department or further inpatient treatment.  I have discussed the diagnosis with the patient and answered all questions.  Pain is been managed while in the emergency department and patient has no further complaints prior to discharge.  Patient is comfortable with plan discussed in room and is stable for discharge at this time.  I have discussed strict return precautions for returning to the emergency department.  Patient was encouraged to follow-up with PCP/specialist refer to at discharge.    MDM Rules/Calculators/A&P                       Final Clinical Impression(s) / ED Diagnoses Final diagnoses:  Nausea vomiting and diarrhea  Acute nonintractable headache, unspecified headache type  Near syncope    Rx / DC Orders ED Discharge Orders         Ordered    ondansetron (ZOFRAN ODT) 4 MG disintegrating tablet  Every 8 hours PRN     03/08/20 1439           Dilana Mcphie A, PA-C 03/08/20 1448    Hayden Rasmussen, MD 03/08/20 1911

## 2020-03-08 NOTE — ED Triage Notes (Signed)
Pt arrives with c/o NV that started around 7 am today while at work pt reports feeling dizzy with vomiting, took a pregnancy test that was negative.

## 2020-03-08 NOTE — Discharge Instructions (Addendum)
Take the antinausea medicine as prescribed.  You did not want any further work-up here in the emergency department.  You may return anytime for symptoms are worsening.

## 2020-03-08 NOTE — ED Notes (Signed)
Pt on monitor 

## 2022-08-01 ENCOUNTER — Emergency Department (HOSPITAL_BASED_OUTPATIENT_CLINIC_OR_DEPARTMENT_OTHER)
Admission: EM | Admit: 2022-08-01 | Discharge: 2022-08-01 | Disposition: A | Discharge status: Home / Self Care | Payer: Medicare HMO | Attending: Emergency Medicine | Admitting: Emergency Medicine

## 2022-08-01 ENCOUNTER — Encounter (HOSPITAL_BASED_OUTPATIENT_CLINIC_OR_DEPARTMENT_OTHER): Payer: Self-pay | Admitting: Emergency Medicine

## 2022-08-01 ENCOUNTER — Other Ambulatory Visit: Payer: Self-pay

## 2022-08-01 ENCOUNTER — Emergency Department (HOSPITAL_BASED_OUTPATIENT_CLINIC_OR_DEPARTMENT_OTHER): Payer: Medicare HMO

## 2022-08-01 DIAGNOSIS — Y92410 Unspecified street and highway as the place of occurrence of the external cause: Secondary | ICD-10-CM | POA: Insufficient documentation

## 2022-08-01 DIAGNOSIS — S060X0A Concussion without loss of consciousness, initial encounter: Secondary | ICD-10-CM | POA: Diagnosis not present

## 2022-08-01 HISTORY — DX: Post-traumatic stress disorder, unspecified: F43.10

## 2022-08-01 LAB — PREGNANCY, URINE: Preg Test, Ur: NEGATIVE

## 2022-08-01 MED ORDER — CYCLOBENZAPRINE HCL 10 MG PO TABS
10.0000 mg | ORAL_TABLET | Freq: Once | ORAL | Status: AC
  Administered 2022-08-01: 10 mg via ORAL
  Filled 2022-08-01: qty 1

## 2022-08-01 MED ORDER — ACETAMINOPHEN 325 MG PO TABS
650.0000 mg | ORAL_TABLET | Freq: Once | ORAL | Status: AC
  Administered 2022-08-01: 650 mg via ORAL
  Filled 2022-08-01: qty 2

## 2022-08-01 MED ORDER — CYCLOBENZAPRINE HCL 10 MG PO TABS: 10.0000 mg | ORAL_TABLET | Freq: Two times a day (BID) | ORAL | 0 refills | Status: AC | PRN

## 2022-08-01 MED ORDER — ONDANSETRON HCL 4 MG PO TABS: 4.0000 mg | ORAL_TABLET | Freq: Three times a day (TID) | ORAL | 0 refills | Status: AC | PRN
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
## 2022-08-01 NOTE — ED Notes (Signed)
Soft collar placed on patient in triage
